# Patient Record
Sex: Female | Born: 1965 | Race: White | Hispanic: No | Marital: Married | Smoking: Never smoker
Health system: Southern US, Community
[De-identification: ages and names within clinical notes are randomized; demographics above are authoritative.]

## PROBLEM LIST (undated history)

## (undated) DIAGNOSIS — R000 Tachycardia, unspecified: Secondary | ICD-10-CM

## (undated) DIAGNOSIS — E119 Type 2 diabetes mellitus without complications: Secondary | ICD-10-CM

## (undated) HISTORY — DX: Type 2 diabetes mellitus without complications: E11.9

## (undated) HISTORY — DX: Tachycardia, unspecified: R00.0

## (undated) HISTORY — PX: KNEE SURGERY: SHX244

## (undated) HISTORY — PX: CHOLECYSTECTOMY: SHX55

## (undated) HISTORY — PX: TUBAL LIGATION: SHX77

## (undated) HISTORY — PX: TMJ ARTHROPLASTY: SHX1066

## (undated) HISTORY — PX: OTHER SURGICAL HISTORY: SHX169

---

## 2005-06-25 ENCOUNTER — Ambulatory Visit: Payer: Self-pay | Admitting: Occupational Therapy

## 2006-02-13 ENCOUNTER — Ambulatory Visit (HOSPITAL_COMMUNITY): Admission: RE | Admit: 2006-02-13 | Discharge: 2006-02-13 | Payer: Self-pay | Admitting: Nurse Practitioner

## 2012-12-31 DIAGNOSIS — C562 Malignant neoplasm of left ovary: Secondary | ICD-10-CM | POA: Insufficient documentation

## 2012-12-31 HISTORY — DX: Malignant neoplasm of left ovary: C56.2

## 2013-01-12 ENCOUNTER — Encounter: Payer: Self-pay | Admitting: *Deleted

## 2013-01-17 ENCOUNTER — Ambulatory Visit: Payer: BC Managed Care – PPO | Attending: Gynecology | Admitting: Gynecology

## 2013-01-17 ENCOUNTER — Encounter: Payer: Self-pay | Admitting: Gynecology

## 2013-01-17 DIAGNOSIS — C562 Malignant neoplasm of left ovary: Secondary | ICD-10-CM

## 2013-01-17 DIAGNOSIS — C569 Malignant neoplasm of unspecified ovary: Secondary | ICD-10-CM

## 2013-01-17 DIAGNOSIS — D3912 Neoplasm of uncertain behavior of left ovary: Secondary | ICD-10-CM

## 2013-01-17 HISTORY — DX: Malignant neoplasm of unspecified ovary: C56.9

## 2013-01-17 NOTE — Patient Instructions (Signed)
Dr. Lavonda Jumbo office will contact you to scheduled initial evaluation and chemotherapy. Prior to starting chemotherapy and recommend we obtain a CT scan of the abdomen and pelvis as well as tumor markers.

## 2013-01-17 NOTE — Progress Notes (Signed)
Consult Note: Gyn-Onc   Nicole Beck 47 y.o. female  Chief Complaint  Patient presents with  . 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 I recommend she received 4 cycles of BEP. Prior to initiating chemotherapy I recommend she have a baseline CT scan of the abdomen and pelvis and baseline tumor markers including inhibin B. and AMH.  We'll refer the patient to Dr. Zipporah Plants in [place] to manage the chemotherapy.  30 minutes of face-to-face consultation time was spent with the patient her husband and sister.    HPI: Patient is seen in consultation at the request of Dr.Chris Senaida Ores regarding management of a newly diagnosed granulosa cell tumor of the left ovary. Patient initially presented several weeks prior to surgery with abdominal pain and apparent rupture of the ovarian mass which measured 9 cm. According to the operative note there was no evidence of other metastatic disease. The patient's had an uncomplicated postoperative course. She has no past gynecologic history.  Review of Systems:10 point review of systems is negative except as noted in interval history.   Vitals: There were no vitals taken for this visit.  Physical Exam:  Deferred     No Known Allergies  Past Medical History  Diagnosis Date  . Tachycardia   . Diabetes mellitus without complication     diet controlled    Past Surgical History  Procedure Laterality Date  . Knee surgery Left   . Tmj arthroplasty Right   . Cholecystectomy    . Left salpingoophorectomy Left   . Tubal ligation      Current Outpatient Prescriptions  Medication Sig Dispense Refill  . diltiazem (DILACOR XR) 180 MG 24 hr capsule Take 180 mg by mouth daily.      Marland Kitchen omeprazole (PRILOSEC) 20 MG capsule Take 20 mg by mouth daily.       No  current facility-administered medications for this visit.    History   Social History  . Marital Status: Married    Spouse Name: N/A    Number of Children: N/A  . Years of Education: N/A   Occupational History  . Not on file.   Social History Main Topics  . Smoking status: Not on file  . Smokeless tobacco: Never Used  . Alcohol Use: No  . Drug Use: No  . Sexually Active: Yes   Other Topics Concern  . Not on file   Social History Narrative  . No narrative on file    Family History  Problem Relation Age of Onset  . Diabetes Mother   . Cancer Mother   . CAD Father   . Diabetes Sister   . CAD Sister   . Cancer Sister   . Diabetes Maternal Grandmother   . CAD Maternal Grandfather   . Cancer Maternal Aunt       Jeannette Corpus, MD 01/17/2013, 1:18 PM

## 2013-04-26 ENCOUNTER — Other Ambulatory Visit: Payer: Self-pay | Admitting: Gynecologic Oncology

## 2013-04-26 ENCOUNTER — Telehealth: Payer: Self-pay | Admitting: Gynecologic Oncology

## 2013-04-26 NOTE — Telephone Encounter (Signed)
Contacted [facility] and spoke with the RN for Dr. Gilman Buttner about Dr. Nelwyn Salisbury recommendations to see the patient after her CT and lab work.  Verbalizing understanding.

## 2013-07-29 ENCOUNTER — Encounter: Payer: Self-pay | Admitting: Gynecology

## 2013-07-29 ENCOUNTER — Ambulatory Visit: Payer: BC Managed Care – PPO | Attending: Gynecology | Admitting: Gynecology

## 2013-07-29 VITALS — BP 133/89 | HR 96 | Temp 97.8°F | Resp 16 | Ht 67.0 in | Wt 191.6 lb

## 2013-07-29 DIAGNOSIS — Z79899 Other long term (current) drug therapy: Secondary | ICD-10-CM | POA: Insufficient documentation

## 2013-07-29 DIAGNOSIS — C569 Malignant neoplasm of unspecified ovary: Secondary | ICD-10-CM | POA: Insufficient documentation

## 2013-07-29 DIAGNOSIS — I2699 Other pulmonary embolism without acute cor pulmonale: Secondary | ICD-10-CM | POA: Insufficient documentation

## 2013-07-29 DIAGNOSIS — R000 Tachycardia, unspecified: Secondary | ICD-10-CM | POA: Insufficient documentation

## 2013-07-29 DIAGNOSIS — E119 Type 2 diabetes mellitus without complications: Secondary | ICD-10-CM | POA: Insufficient documentation

## 2013-07-29 DIAGNOSIS — D3912 Neoplasm of uncertain behavior of left ovary: Secondary | ICD-10-CM

## 2013-07-29 DIAGNOSIS — G589 Mononeuropathy, unspecified: Secondary | ICD-10-CM | POA: Insufficient documentation

## 2013-07-29 DIAGNOSIS — Z7901 Long term (current) use of anticoagulants: Secondary | ICD-10-CM | POA: Insufficient documentation

## 2013-07-29 NOTE — Progress Notes (Signed)
Consult Note: Gyn-Onc   Nicole Beck 47 y.o. female  Chief Complaint  Patient presents with  . 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 The patient appears to be clinically free of disease.  Persistent neuropathy, pulmonary embolus,  Plan:  Once the patient completes her 6 month course of warfarin, she is planning on undergoing a total abdominal hysterectomy and right salpingo-oophorectomy under the direction of Dr. Lester [place]. At the time of surgery I recommend full intraperitoneal exploration as well as obtaining peritoneal cytology. Recommend the patient continue to have inhibin B. and AMH values obtained at six-month intervals postoperatively.  Interval history:  Since her initial consultation the patient is completed 4 cycles of BEP under the direction of Dr. Gery Pray in [place].  Her treatment course was complicated by significant neuropathy (which is improving) as well as a pulmonary embolus (currently on warfarin). Tumor markers in September revealed an a MH and inhibin B. is normal.  The patient denies any GI or GU symptoms has no pelvic pain pressure or vaginal bleeding. Her last menstrual period was in June 2014.    HPI: Patient is seen in consultation at the request of Dr.Chris Senaida Ores regarding management of a newly diagnosed granulosa cell tumor of the left ovary. Patient initially presented several weeks prior to surgery with abdominal pain and apparent rupture of the ovarian mass which measured 9 cm. According to the operative note there was no evidence of other metastatic disease. The patient's had an uncomplicated postoperative course. She has no past gynecologic history.  Review of Systems:10 point review of systems is negative except as noted in interval history.   Vitals: Blood pressure 133/89, pulse 96, temperature 97.8 F (36.6 C),  resp. rate 16, height 5\' 7"  (1.702 m), weight 191 lb 9.6 oz (86.909 kg), last menstrual period 03/18/2013.  Physical Exam: HEENT: Normal  Neck is supple without thyromegaly.  Abdomen is soft nontender no masses again a megaly or ascites are noted.  Pelvic exam  EGBUS normal  Vagina normal  Cervix normal  Uterus is anterior normal shape size consistency  Adnexa without masses  Rectovaginal exam confirms.  Lower extremities without edema or varicosities.     No Known Allergies  Past Medical History  Diagnosis Date  . Tachycardia   . Diabetes mellitus without complication     diet controlled    Past Surgical History  Procedure Laterality Date  . Knee surgery Left   . Tmj arthroplasty Right   . Cholecystectomy    . Left salpingoophorectomy Left   . Tubal ligation      Current Outpatient Prescriptions  Medication Sig Dispense Refill  . diltiazem (DILACOR XR) 180 MG 24 hr capsule Take 180 mg by mouth daily.      . sertraline (ZOLOFT) 50 MG tablet Take 50 mg by mouth daily.      . tapentadol (NUCYNTA) 50 MG TABS tablet Take 50 mg by mouth daily. Takes at night only.      . warfarin (COUMADIN) 5 MG tablet Take 5 mg by mouth daily. 2 whole tablets twice a week on Monday and Friday. The rest of the week 1/2 tablet(Tues, wed,thur,sat, sun)      . omeprazole (PRILOSEC) 20 MG capsule Take 20 mg by mouth daily.       No current facility-administered medications for this visit.  History   Social History  . Marital Status: Married    Spouse Name: N/A    Number of Children: N/A  . Years of Education: N/A   Occupational History  . Not on file.   Social History Main Topics  . Smoking status: Never Smoker   . Smokeless tobacco: Never Used  . Alcohol Use: No  . Drug Use: No  . Sexual Activity: Yes   Other Topics Concern  . Not on file   Social History Narrative  . No narrative on file    Family History  Problem Relation Age of Onset  . Diabetes Mother    . Cancer Mother   . CAD Father   . Diabetes Sister   . CAD Sister   . Cancer Sister   . Diabetes Maternal Grandmother   . CAD Maternal Grandfather   . Cancer Maternal Aunt       Jeannette Corpus, MD 07/29/2013, 11:51 AM

## 2013-07-29 NOTE — Patient Instructions (Signed)
Dr. Gilman Buttner will follow you. we would be happy to see you in the future if needed.

## 2015-01-19 ENCOUNTER — Ambulatory Visit: Payer: Self-pay | Admitting: Gynecologic Oncology

## 2015-01-19 ENCOUNTER — Telehealth: Payer: Self-pay | Admitting: *Deleted

## 2015-01-19 NOTE — Telephone Encounter (Signed)
Called patient to reschedule missed MD visit this morning. Patient states she called this morning and left a voicemail for scheduling stating she was unable to make visit. I asked patient what phone number she called and patient states it was "whatever number was on the automated message reminder call." Patient rescheduled to Monday, 01/22/15, at 2:30pm. Patient agreeable to new appt.

## 2015-01-22 ENCOUNTER — Encounter: Payer: Self-pay | Admitting: Gynecologic Oncology

## 2015-01-22 ENCOUNTER — Ambulatory Visit: Payer: 59 | Attending: Gynecologic Oncology | Admitting: Gynecologic Oncology

## 2015-01-22 DIAGNOSIS — Z86711 Personal history of pulmonary embolism: Secondary | ICD-10-CM

## 2015-01-22 DIAGNOSIS — R600 Localized edema: Secondary | ICD-10-CM | POA: Diagnosis not present

## 2015-01-22 DIAGNOSIS — C562 Malignant neoplasm of left ovary: Secondary | ICD-10-CM | POA: Insufficient documentation

## 2015-01-22 DIAGNOSIS — Z8543 Personal history of malignant neoplasm of ovary: Secondary | ICD-10-CM | POA: Diagnosis not present

## 2015-01-22 NOTE — Patient Instructions (Signed)
Preparing for your Surgery  Plan for surgery on July 12 with Dr. Denman George.  Pre-operative Testing -You will receive a phone call from presurgical testing at [facility] to arrange for a pre-operative testing appointment before your surgery.  This appointment normally occurs one to two weeks before your scheduled surgery.   -Bring your insurance card, copy of an advanced directive if applicable, medication list  -At that visit, you will be asked to sign a consent for a possible blood transfusion in case a transfusion becomes necessary during surgery.  The need for a blood transfusion is rare but having consent is a necessary part of your care.     -You should not be taking blood thinners or aspirin at least ten days prior to surgery unless instructed by your surgeon.  Day Before Surgery at [facility] will be asked to take in only clear liquids the day before surgery.  Examples of clear liquids include broths, jello, and clear juices.  You will be advised to have nothing to eat or drink after midnight the evening before.    Your role in recovery Your role is to become active as soon as directed by your doctor, while still giving yourself time to heal.  Rest when you feel tired. You will be asked to do the following in order to speed your recovery:  - Cough and breathe deeply. This helps toclear and expand your lungs and can prevent pneumonia. You may be given a spirometer to practice deep breathing. A staff member will show you how to use the spirometer. - Do mild physical activity. Walking or moving your legs help your circulation and body functions return to normal. A staff member will help you when you try to walk and will provide you with simple exercises. Do not try to get up or walk alone the first time. - Actively manage your pain. Managing your pain lets you move in comfort. We will ask you to rate your pain on a scale of zero to 10. It is your responsibility to tell  your doctor or nurse where and how much you hurt so your pain can be treated.  Special Considerations -If you are diabetic, you may be placed on insulin after surgery to have closer control over your blood sugars to promote healing and recovery.  This does not mean that you will be discharged on insulin.  If applicable, your oral antidiabetics will be resumed when you are tolerating a solid diet.  -Your final pathology results from surgery should be available by the Friday after surgery and the results will be relayed to you when available.  Blood Transfusion Information WHAT IS A BLOOD TRANSFUSION? A transfusion is the replacement of blood or some of its parts. Blood is made up of multiple cells which provide different functions.  Red blood cells carry oxygen and are used for blood loss replacement.  White blood cells fight against infection.  Platelets control bleeding.  Plasma helps clot blood.  Other blood products are available for specialized needs, such as hemophilia or other clotting disorders. BEFORE THE TRANSFUSION  Who gives blood for transfusions?   You may be able to donate blood to be used at a later date on yourself (autologous donation).  Relatives can be asked to donate blood. This is generally not any safer than if you have received blood from a stranger. The same precautions are taken to ensure safety when a relative's blood is donated.  Healthy volunteers who are fully  evaluated to make sure their blood is safe. This is blood bank blood. Transfusion therapy is the safest it has ever been in the practice of medicine. Before blood is taken from a donor, a complete history is taken to make sure that person has no history of diseases nor engages in risky social behavior (examples are intravenous drug use or sexual activity with multiple partners). The donor's travel history is screened to minimize risk of transmitting infections, such as malaria. The donated blood is  tested for signs of infectious diseases, such as HIV and hepatitis. The blood is then tested to be sure it is compatible with you in order to minimize the chance of a transfusion reaction. If you or a relative donates blood, this is often done in anticipation of surgery and is not appropriate for emergency situations. It takes many days to process the donated blood. RISKS AND COMPLICATIONS Although transfusion therapy is very safe and saves many lives, the main dangers of transfusion include:   Getting an infectious disease.  Developing a transfusion reaction. This is an allergic reaction to something in the blood you were given. Every precaution is taken to prevent this. The decision to have a blood transfusion has been considered carefully by your caregiver before blood is given. Blood is not given unless the benefits outweigh the risks.

## 2015-01-22 NOTE — Progress Notes (Signed)
Consult Note: Gyn-Onc   Nicole Beck 49 y.o. female  Chief Complaint  Patient presents with  . 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 The patient appears to be clinically free of disease.  Persistent neuropathy, pulmonary embolus (June 2014),  Plan:  Now that she has completed her 6 month course of warfarin, she is interested in having a completion hysterectomy and right salpingo-oophorectomy. I will discuss with Dr. Ellouise Newer whether he would be interestd in performing this. At the time of surgery I recommend full intraperitoneal exploration as well as obtaining peritoneal cytology. Recommend the patient continue to have inhibin B. and AMH values obtained at six-month intervals postoperatively. We will check her next set at preoperative evaluation.  If her lower extremity edema increases we should repeat imaging of the pelvis to rule out new lymphadenopathy (will hold off for now because patient feels some improvement with diet control).  Interval history:  Since her initial consultation the patient is completed 4 cycles of BEP under the direction of Dr. Hosie Poisson in [place].  Her treatment course was complicated by significant neuropathy (which is improving) as well as a pulmonary embolus (currently on warfarin). Tumor markers in September 2015 revealed an a MH and inhibin B. is normal.  CT abdo/pelvis January 2016 showed a stable right ovarian cyst and no lymphadenopathy or carcinomatosis  The patient denies any GI or GU symptoms has no pelvic pain pressure or vaginal bleeding. Her last menstrual period was in June 2014. She denies postmenopausal bleeding. She has noted increased right lower extremity edema in the past 3 months, however feels this is secondary to her celiac's disease.   HPI: Patient was initially seen in consultation at the  request of Dr.Chris Marvel Plan regarding management of a newly diagnosed granulosa cell tumor of the left ovary. Patient initially presented several weeks prior to surgery with abdominal pain and apparent rupture of the ovarian mass which measured 9 cm. According to the operative note there was no evidence of other metastatic disease. The patient's had an uncomplicated postoperative course. She has no past gynecologic history.  Review of Systems:10 point review of systems is negative except as noted in interval history.   Vitals: There were no vitals taken for this visit.  Physical Exam: HEENT: Normal  Neck is supple without thyromegaly.  Abdomen is soft nontender no masses again a megaly or ascites are noted.  Pelvic exam  EGBUS normal  Vagina normal  Cervix normal  Uterus is anterior normal shape size consistency  Adnexa without masses  Rectovaginal exam confirms.  Lower extremities without edema or varicosities.     No Known Allergies  Past Medical History  Diagnosis Date  . Tachycardia   . Diabetes mellitus without complication     diet controlled    Past Surgical History  Procedure Laterality Date  . Knee surgery Left   . Tmj arthroplasty Right   . Cholecystectomy    . Left salpingoophorectomy Left   . Tubal ligation      Current Outpatient Prescriptions  Medication Sig Dispense Refill  . diltiazem (DILACOR XR) 180 MG 24 hr capsule Take 180 mg by mouth daily.    Marland Kitchen omeprazole (PRILOSEC) 20 MG capsule Take 20 mg by mouth daily.    . sertraline (ZOLOFT) 50 MG tablet Take 50 mg by mouth daily.    Marland Kitchen  tapentadol (NUCYNTA) 50 MG TABS tablet Take 50 mg by mouth daily. Takes at night only.    . warfarin (COUMADIN) 5 MG tablet Take 5 mg by mouth daily. 2 whole tablets twice a week on Monday and Friday. The rest of the week 1/2 tablet(Tues, wed,thur,sat, sun)     No current facility-administered medications for this visit.    History   Social History  .  Marital Status: Married    Spouse Name: N/A  . Number of Children: N/A  . Years of Education: N/A   Occupational History  . Not on file.   Social History Main Topics  . Smoking status: Never Smoker   . Smokeless tobacco: Never Used  . Alcohol Use: No  . Drug Use: No  . Sexual Activity: Yes   Other Topics Concern  . Not on file   Social History Narrative    Family History  Problem Relation Age of Onset  . Diabetes Mother   . Cancer Mother   . CAD Father   . Diabetes Sister   . CAD Sister   . Cancer Sister   . Diabetes Maternal Grandmother   . CAD Maternal Grandfather   . Cancer Maternal Aunt       Donaciano Eva, MD 01/22/2015, 3:49 PM

## 2015-01-26 ENCOUNTER — Telehealth: Payer: Self-pay | Admitting: Gynecologic Oncology

## 2015-01-26 NOTE — Telephone Encounter (Signed)
Called to speak with patient about Coumadin being on her medication list.  Patient stating she has not been on this for awhile.  She met with Dr. Hinton Rao today and stated she would like to hold off on having surgery right now because she will be seeing Dr. Hinton Rao every four months and it is too stressful for her right now.  Informed that her surgery will be canceled.  She stated she would consider it "if something changes in my blood."  Advised to call for any questions or concerns.

## 2015-03-01 ENCOUNTER — Telehealth: Payer: Self-pay | Admitting: *Deleted

## 2015-03-01 NOTE — Telephone Encounter (Signed)
VM message from pt received @ 11:13 am regarding new vaginal bleeding.  She reports she has not had any vaginal bleeding since June 2014 and she is concerned that something is wrong.  Please call pt.

## 2015-03-12 ENCOUNTER — Other Ambulatory Visit (HOSPITAL_BASED_OUTPATIENT_CLINIC_OR_DEPARTMENT_OTHER): Payer: 59

## 2015-03-12 ENCOUNTER — Encounter: Payer: Self-pay | Admitting: Gynecologic Oncology

## 2015-03-12 ENCOUNTER — Ambulatory Visit: Payer: 59 | Attending: Gynecologic Oncology | Admitting: Gynecologic Oncology

## 2015-03-12 ENCOUNTER — Other Ambulatory Visit (HOSPITAL_COMMUNITY)
Admission: RE | Admit: 2015-03-12 | Discharge: 2015-03-12 | Disposition: A | Discharge status: Home / Self Care | Payer: 59 | Source: Ambulatory Visit | Attending: Gynecologic Oncology | Admitting: Gynecologic Oncology

## 2015-03-12 VITALS — BP 118/75 | HR 82 | Temp 98.0°F | Resp 18 | Ht 67.0 in | Wt 213.5 lb

## 2015-03-12 DIAGNOSIS — Z8543 Personal history of malignant neoplasm of ovary: Secondary | ICD-10-CM

## 2015-03-12 DIAGNOSIS — N939 Abnormal uterine and vaginal bleeding, unspecified: Secondary | ICD-10-CM

## 2015-03-12 DIAGNOSIS — Z01411 Encounter for gynecological examination (general) (routine) with abnormal findings: Secondary | ICD-10-CM | POA: Diagnosis present

## 2015-03-12 DIAGNOSIS — Z9221 Personal history of antineoplastic chemotherapy: Secondary | ICD-10-CM

## 2015-03-12 DIAGNOSIS — Z86711 Personal history of pulmonary embolism: Secondary | ICD-10-CM | POA: Diagnosis not present

## 2015-03-12 NOTE — Progress Notes (Signed)
Consult Note: Gyn-Onc   Nicole Beck 49 y.o. female  Chief Complaint  Patient presents with  . 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 The patient appears to be clinically free of disease, however has postmenopausal bleeding x 1 episode.  History of pulmonary embolus (June 2014),  Plan:  Now that she has completed her 6 month course of warfarin (in December 2014), she was recommended to have a completion hysterectomy and right salpingo-oophorectomy.Dr. Ellouise Newer is her primary Gynecologist. I had discussed surgery with the patient in April 2016, and discussed perioperative risks. She had initially scheduled the surgery, but then cancelled after becoming very concerned about surgical risks. She is continuing to elect to hold off having a hysterectomy unless an abnormality is found.   I see no signs of abnormality on clinical exam however, on my differential is recurrence of her granulosa cell tumor causing endometrial proliferation and bleeding. Therefore we have ordered an [place] (to evaluate for menopausal status after chemotherapy), an AMH and inhibin B (to monitor for recurrent granulosa cell tumor), and a transvaginal ultrasound to evaluate the endometrial stripe and remaining right ovary and tube. If there is evidence of endometrial hyperplasia or thickening on ultrasound, or abnormal elevations in her tumor markers, I will strongly recommend surgical intervention with hysterectomy RSO, omentectomy and peritoneal sampling. If all are normal and her [facility] suggests that she is premenopausal, we will continue to follow expectantly.  I will see her back in 3 months for ongoing surveillance of her granulosa cell tumor.   HPI: Patient was initially seen in consultation at the request of Dr.Chris Marvel Plan regarding management of a newly diagnosed granulosa cell tumor  of the left ovary. Patient initially presented several weeks prior to surgery with abdominal pain and apparent rupture of the ovarian mass which measured 9 cm. According to the operative note there was no evidence of other metastatic disease. She developed a PE in June, 2014 and was treated with warfarin until December 2014.   She has no other past gynecologic history with the exception of her GCT.  She completed 4 cycles of BEP under the direction of Dr. Hosie Poisson in [place].  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   CT abdo/pelvis January 2016 showed a stable right ovarian cyst and no lymphadenopathy or carcinomatosis  Tumor markers in April 2016 revealed an a MH and inhibin B. is normal.  Interval Hx:  She began developing symptoms of vaginal spotting x 4 days in May, 2016. This worried her greatly as she had not had a period since June, 2014.  Review of Systems:10 point review of systems is negative except as noted in interval history.   Vitals: Blood pressure 118/75, pulse 82, temperature 98 F (36.7 C), temperature source Oral, resp. rate 18, height 5\' 7"  (1.702 m), weight 213 lb 8 oz (96.843 kg), SpO2 100 %.  Physical Exam: HEENT: Normal  Neck is supple without thyromegaly.  Abdomen is soft nontender no masses again a megaly or ascites are noted.  Pelvic exam  EGBUS normal  Vagina normal  Cervix normal. Pap taken.  Uterus is anterior normal shape size consistency  Adnexa without masses  Rectovaginal exam confirms.  Lower extremities without edema or varicosities.     No Known Allergies  Past Medical History  Diagnosis Date  . Tachycardia   . Diabetes mellitus without complication     diet controlled    Past Surgical History  Procedure Laterality Date  . Knee surgery Left   . Tmj arthroplasty Right   . Cholecystectomy    . Left salpingoophorectomy Left    . Tubal ligation      Current Outpatient Prescriptions  Medication Sig Dispense Refill  . albuterol (PROVENTIL) (2.5 MG/3ML) 0.083% nebulizer solution as needed.     . Cyanocobalamin (VITAMIN B-12 IJ) Inject as directed. Per pt, she gets vitamin B12 injection monthly at PCP office    . diltiazem (DILACOR XR) 180 MG 24 hr capsule Take 180 mg by mouth daily.    Marland Kitchen omeprazole (PRILOSEC) 20 MG capsule Take 20 mg by mouth daily.    . diazepam (VALIUM) 2 MG tablet TAKE 1 TABLET BY MOUTH 3 TIMES A DAY AS NEEDED FOR SPASMS  0   No current facility-administered medications for this visit.    History   Social History  . Marital Status: Married    Spouse Name: N/A  . Number of Children: N/A  . Years of Education: N/A   Occupational History  . Not on file.   Social History Main Topics  . Smoking status: Never Smoker   . Smokeless tobacco: Never Used  . Alcohol Use: No  . Drug Use: No  . Sexual Activity: Yes   Other Topics Concern  . Not on file   Social History Narrative    Family History  Problem Relation Age of Onset  . Diabetes Mother   . Cancer Mother   . CAD Father   . Diabetes Sister   . CAD Sister   . Cancer Sister   . Diabetes Maternal Grandmother   . CAD Maternal Grandfather   . Cancer Maternal Aunt       Donaciano Eva, MD 03/12/2015, 10:24 AM

## 2015-03-12 NOTE — Patient Instructions (Signed)
We will call you with the results of your pap smear and labs from today. Followup with Dr. Denman George in 3 months as scheduled or please call us sooner with any additional questions or concerns. U/S scheduled June 20th @ 10:00 we will call you with results once scan is complete and has been reviewed.

## 2015-03-15 LAB — INHIBIN B: Inhibin B: <10 pg/mL

## 2015-03-15 LAB — INHIBIN A: Inhibin-A: <1 pg/mL

## 2015-03-15 LAB — FOLLICLE STIMULATING HORMONE: FSH: 76.3 m[IU]/mL

## 2015-03-15 LAB — ANTI MULLERIAN HORMONE: AMH AssessR: <0.03 ng/mL

## 2015-03-16 LAB — CYTOLOGY - PAP

## 2015-03-19 ENCOUNTER — Other Ambulatory Visit: Payer: Self-pay | Admitting: Gynecologic Oncology

## 2015-03-19 ENCOUNTER — Ambulatory Visit (HOSPITAL_COMMUNITY)
Admission: RE | Admit: 2015-03-19 | Discharge: 2015-03-19 | Disposition: A | Discharge status: Home / Self Care | Payer: 59 | Source: Ambulatory Visit | Attending: Gynecologic Oncology | Admitting: Gynecologic Oncology

## 2015-03-19 ENCOUNTER — Ambulatory Visit (HOSPITAL_COMMUNITY): Payer: 59

## 2015-03-19 DIAGNOSIS — N939 Abnormal uterine and vaginal bleeding, unspecified: Secondary | ICD-10-CM

## 2015-03-20 ENCOUNTER — Telehealth: Payer: Self-pay | Admitting: Gynecologic Oncology

## 2015-03-20 NOTE — Telephone Encounter (Signed)
Spoke with patient about pap results and lab results.  Based on her lab results, she is post menopausal per Dr. Denman George.  It was recommended that she proceed with an ultrasound to evaluate the endometrium due to post-menopausal bleeding.  Ultrasound reviewed by Dr. Denman George, who recommended the patient come into the office for an endometrial biopsy.  Patient verbalizing understanding.  Appt made for June 27.  Advised to call for any questions or concerns.

## 2015-03-26 ENCOUNTER — Ambulatory Visit: Payer: 59 | Attending: Gynecologic Oncology | Admitting: Gynecologic Oncology

## 2015-03-26 ENCOUNTER — Encounter: Payer: Self-pay | Admitting: Gynecologic Oncology

## 2015-03-26 ENCOUNTER — Ambulatory Visit: Payer: 59 | Admitting: Gynecologic Oncology

## 2015-03-26 VITALS — BP 111/82 | HR 85 | Temp 98.7°F | Resp 20 | Ht 67.0 in | Wt 214.1 lb

## 2015-03-26 DIAGNOSIS — D3912 Neoplasm of uncertain behavior of left ovary: Secondary | ICD-10-CM

## 2015-03-26 NOTE — Patient Instructions (Signed)
We will contact you with the results of your endometrial biopsy from today.  Please call for any questions or concerns.  You may have vaginal spotting after this procedure.  Endometrial Biopsy, Care After Refer to this sheet in the next few weeks. These instructions provide you with information on caring for yourself after your procedure. Your health care provider may also give you more specific instructions. Your treatment has been planned according to current medical practices, but problems sometimes occur. Call your health care provider if you have any problems or questions after your procedure. WHAT TO EXPECT AFTER THE PROCEDURE After your procedure, it is typical to have the following:  You may have mild cramping and a small amount of vaginal bleeding for a few days after the procedure. This is normal. HOME CARE INSTRUCTIONS  Only take over-the-counter or prescription medicine as directed by your health care provider.  Do not douche, use tampons, or have sexual intercourse until your health care provider approves.  Follow your health care provider's instructions regarding any activity restrictions, such as strenuous exercise or heavy lifting. SEEK MEDICAL CARE IF:  You have heavy bleeding or bleeding longer than 2 days after the procedure.  You have bad smelling drainage from your vagina.  You have a fever and chills.  Youhave severe lower stomach (abdominal) pain. SEEK IMMEDIATE MEDICAL CARE IF:  You have severe cramps in your stomach or back.  You pass large blood clots.  Your bleeding increases.  You become weak or lightheaded, or you pass out. Document Released: 07/06/2013 Document Reviewed: 07/06/2013 [facility] Patient Information 2015 [place], [place]. This information is not intended to replace advice given to you by your health care provider. Make sure you discuss any questions you have with your health care provider.

## 2015-03-26 NOTE — Progress Notes (Signed)
Consult Note: Gyn-Onc   Nicole Beck 49 y.o. female  Chief Complaint  Patient presents with  . 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 The patient appears to be clinically free of disease, however has postmenopausal bleeding x 1 episode, thickened endometrial stripe (26mm) on [place], and labs that reflect postmenopausal status.  History of pulmonary embolus (June 2014),  Plan:  Now that she has completed her 6 month course of warfarin (in December 2014), she was recommended to have a completion hysterectomy and right salpingo-oophorectomy.Dr. Ellouise Newer is her primary Gynecologist. I had discussed surgery with the patient in April 2016, and discussed perioperative risks. She had initially scheduled the surgery, but then cancelled after becoming very concerned about surgical risks. She is continuing to elect to hold off having a hysterectomy unless an abnormality is found.   Follow-up results of biopsy - if malignant, recommend surgical staging. If benign, continue to follow expectantly.   HPI: Patient was initially seen in consultation at the request of Dr.Chris Marvel Plan regarding management of a newly diagnosed granulosa cell tumor of the left ovary. Patient initially presented several weeks prior to surgery with abdominal pain and apparent rupture of the ovarian mass which measured 9 cm. According to the operative note there was no evidence of other metastatic disease. She developed a PE in June, 2014 and was treated with warfarin until December 2014.   She has no other past gynecologic history with the exception of her GCT.  She completed 4 cycles of BEP under the direction of Dr. Hosie Poisson in [place].  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   CT  abdo/pelvis January 2016 showed a stable right ovarian cyst and no lymphadenopathy or carcinomatosis  Tumor markers in April 2016 revealed an a MH and inhibin B. is normal.  She experienced postmenopausal bleeding in May 2016. FSH was elevated (postmenopausal) at 76, AMR is normal as was inhibin B. All within postmenopausal range. Transvaginal US on 03/19/15 showed a 6.6x3.1x4.1cm uterus with a 26mm endometrial stripe. The right ovary contained a simple cyst with benign features and measured 3.9x2.2x2.7cm.   Interval Hx:  She has had no further spotting  Review of Systems:10 point review of systems is negative except as noted in interval history.   Vitals: Blood pressure 111/82, pulse 85, temperature 98.7 F (37.1 C), temperature source Oral, resp. rate 20, height 5\' 7"  (1.702 m), weight 214 lb 1.6 oz (97.115 kg), SpO2 100 %.  Physical Exam: HEENT: Normal  Neck is supple without thyromegaly.  Abdomen is soft nontender no masses again a megaly or ascites are noted.  Pelvic exam  EGBUS normal  Vagina normal  Cervix normal.   Uterus is anterior normal shape size consistency  Adnexa without masses  Rectovaginal exam confirms.  Lower extremities without edema or varicosities.  Procedure: Endometrial biopsy Endometrial Biopsy  The procedure was explained.  The speculum was placed and the cervix was grasped with a tenaculum.  The endometrial biopsy was performed with 2 passes of the endometrial biopsy pipelle. scant tissue was obtained. The uterus sounded to 5 cm.  The patient tolerated procedure     No Known Allergies  Past Medical History  Diagnosis Date  . Tachycardia   . Diabetes mellitus without complication  diet controlled    Past Surgical History  Procedure Laterality Date  . Knee surgery Left   . Tmj arthroplasty Right   . Cholecystectomy    . Left salpingoophorectomy Left   . Tubal ligation      Current Outpatient Prescriptions  Medication Sig  Dispense Refill  . albuterol (PROVENTIL) (2.5 MG/3ML) 0.083% nebulizer solution as needed.     . Cyanocobalamin (VITAMIN B-12 IJ) Inject as directed. Per pt, she gets vitamin B12 injection monthly at PCP office    . diazepam (VALIUM) 2 MG tablet TAKE 1 TABLET BY MOUTH 3 TIMES A DAY AS NEEDED FOR SPASMS  0  . diltiazem (DILACOR XR) 180 MG 24 hr capsule Take 180 mg by mouth daily.    Marland Kitchen omeprazole (PRILOSEC) 20 MG capsule Take 20 mg by mouth daily.    . cyclobenzaprine (FLEXERIL) 10 MG tablet      No current facility-administered medications for this visit.    History   Social History  . Marital Status: Married    Spouse Name: N/A  . Number of Children: N/A  . Years of Education: N/A   Occupational History  . Not on file.   Social History Main Topics  . Smoking status: Never Smoker   . Smokeless tobacco: Never Used  . Alcohol Use: No  . Drug Use: No  . Sexual Activity: Yes   Other Topics Concern  . Not on file   Social History Narrative    Family History  Problem Relation Age of Onset  . Diabetes Mother   . Cancer Mother   . CAD Father   . Diabetes Sister   . CAD Sister   . Cancer Sister   . Diabetes Maternal Grandmother   . CAD Maternal Grandfather   . Cancer Maternal Aunt       Donaciano Eva, MD 03/26/2015, 3:48 PM

## 2015-03-28 ENCOUNTER — Telehealth: Payer: Self-pay | Admitting: *Deleted

## 2015-03-28 NOTE — Telephone Encounter (Signed)
Per Joylene John, NP patient notified that endometrial biopsy did not show any cancer.  Patient appreciative of call. Reminded pt of f/u appt on 06/18/15 with Dr. Denman George. No other questions or concerns noted at this time.

## 2015-03-30 ENCOUNTER — Telehealth: Payer: Self-pay | Admitting: Gynecologic Oncology

## 2015-03-30 NOTE — Telephone Encounter (Signed)
Patient informed of the below message.  No concerns voiced.  Advised to call for any needs or if the bleeding developed again.

## 2015-03-30 NOTE — Telephone Encounter (Signed)
-----   Message from Everitt Amber, MD sent at 03/30/2015 10:03 AM EDT ----- Can we please let Nicole Beck know that her biopsy showed benign changes (a polyp which explains the bleeding). She does not require additional treatment unless the bleeding begins again. Everitt Amber

## 2015-04-10 ENCOUNTER — Encounter (HOSPITAL_COMMUNITY): Admission: RE | Payer: Self-pay | Source: Ambulatory Visit

## 2015-04-10 ENCOUNTER — Ambulatory Visit (HOSPITAL_COMMUNITY): Admission: RE | Admit: 2015-04-10 | Payer: 59 | Source: Ambulatory Visit | Admitting: Gynecologic Oncology

## 2015-04-10 SURGERY — ROBOTIC ASSISTED LAPAROSCOPIC VAGINAL HYSTERECTOMY
Anesthesia: General | Laterality: Right

## 2015-06-18 ENCOUNTER — Ambulatory Visit: Payer: 59 | Admitting: Gynecologic Oncology

## 2015-07-02 DIAGNOSIS — R0602 Shortness of breath: Secondary | ICD-10-CM

## 2015-07-02 DIAGNOSIS — R079 Chest pain, unspecified: Secondary | ICD-10-CM | POA: Insufficient documentation

## 2015-07-02 DIAGNOSIS — R002 Palpitations: Secondary | ICD-10-CM | POA: Insufficient documentation

## 2015-07-02 DIAGNOSIS — R0789 Other chest pain: Secondary | ICD-10-CM | POA: Insufficient documentation

## 2015-07-02 HISTORY — DX: Palpitations: R00.2

## 2015-07-02 HISTORY — DX: Shortness of breath: R06.02

## 2015-07-02 HISTORY — DX: Other chest pain: R07.89

## 2015-10-29 ENCOUNTER — Ambulatory Visit: Payer: Self-pay

## 2015-10-29 ENCOUNTER — Encounter: Payer: Self-pay | Admitting: Gynecologic Oncology

## 2015-10-29 ENCOUNTER — Ambulatory Visit: Payer: Self-pay | Attending: Gynecologic Oncology | Admitting: Gynecologic Oncology

## 2015-10-29 VITALS — BP 131/83 | HR 88 | Temp 97.8°F | Resp 20 | Ht 67.0 in | Wt 219.5 lb

## 2015-10-29 DIAGNOSIS — Z8543 Personal history of malignant neoplasm of ovary: Secondary | ICD-10-CM

## 2015-10-29 DIAGNOSIS — N83201 Unspecified ovarian cyst, right side: Secondary | ICD-10-CM | POA: Insufficient documentation

## 2015-10-29 DIAGNOSIS — R1031 Right lower quadrant pain: Secondary | ICD-10-CM | POA: Insufficient documentation

## 2015-10-29 DIAGNOSIS — E119 Type 2 diabetes mellitus without complications: Secondary | ICD-10-CM | POA: Insufficient documentation

## 2015-10-29 DIAGNOSIS — D3912 Neoplasm of uncertain behavior of left ovary: Secondary | ICD-10-CM | POA: Insufficient documentation

## 2015-10-29 DIAGNOSIS — Z86711 Personal history of pulmonary embolism: Secondary | ICD-10-CM | POA: Insufficient documentation

## 2015-10-29 DIAGNOSIS — Z7901 Long term (current) use of anticoagulants: Secondary | ICD-10-CM | POA: Insufficient documentation

## 2015-10-29 DIAGNOSIS — Z8249 Family history of ischemic heart disease and other diseases of the circulatory system: Secondary | ICD-10-CM | POA: Insufficient documentation

## 2015-10-29 DIAGNOSIS — Z90710 Acquired absence of both cervix and uterus: Secondary | ICD-10-CM | POA: Insufficient documentation

## 2015-10-29 DIAGNOSIS — G629 Polyneuropathy, unspecified: Secondary | ICD-10-CM | POA: Insufficient documentation

## 2015-10-29 DIAGNOSIS — R000 Tachycardia, unspecified: Secondary | ICD-10-CM | POA: Insufficient documentation

## 2015-10-29 DIAGNOSIS — Z833 Family history of diabetes mellitus: Secondary | ICD-10-CM | POA: Insufficient documentation

## 2015-10-29 DIAGNOSIS — Z809 Family history of malignant neoplasm, unspecified: Secondary | ICD-10-CM | POA: Insufficient documentation

## 2015-10-29 NOTE — Patient Instructions (Signed)
Plan to have an ultrasound and labs at [place].                  Preparing for your Surgery  Plan for surgery on December 04, 2015 with Dr. Everitt Amber.  You will be scheduled for a robotic assisted total hysterectomy, right salpingo-oophorectomy.  You will receive lovenox pre-operatively and also for one month post-op.  Pre-operative Testing -You will receive a phone call from presurgical testing at [facility] to arrange for a pre-operative testing appointment before your surgery.  This appointment normally occurs one to two weeks before your scheduled surgery.   -Bring your insurance card, copy of an advanced directive if applicable, medication list  -At that visit, you will be asked to sign a consent for a possible blood transfusion in case a transfusion becomes necessary during surgery.  The need for a blood transfusion is rare but having consent is a necessary part of your care.     -You should not be taking blood thinners or aspirin at least ten days prior to surgery unless instructed by your surgeon.  Day Before Surgery at [place] will be asked to take in only clear liquids the day before surgery.  Examples of clear liquids include broths, jello, and clear juices.  Avoid carbonated beverages.  You will be advised to have nothing to eat or drink after midnight the evening before.    Your role in recovery Your role is to become active as soon as directed by your doctor, while still giving yourself time to heal.  Rest when you feel tired. You will be asked to do the following in order to speed your recovery:  - Cough and breathe deeply. This helps toclear and expand your lungs and can prevent pneumonia. You may be given a spirometer to practice deep breathing. A staff member will show you how to use the spirometer. - Do mild physical activity. Walking or moving your legs help your circulation and body functions return to normal. A staff member will help you when you try to walk and  will provide you with simple exercises. Do not try to get up or walk alone the first time. - Actively manage your pain. Managing your pain lets you move in comfort. We will ask you to rate your pain on a scale of zero to 10. It is your responsibility to tell your doctor or nurse where and how much you hurt so your pain can be treated.  Special Considerations -If you are diabetic, you may be placed on insulin after surgery to have closer control over your blood sugars to promote healing and recovery.  This does not mean that you will be discharged on insulin.  If applicable, your oral antidiabetics will be resumed when you are tolerating a solid diet.  -Your final pathology results from surgery should be available by the Friday after surgery and the results will be relayed to you when available.  Blood Transfusion Information WHAT IS A BLOOD TRANSFUSION? A transfusion is the replacement of blood or some of its parts. Blood is made up of multiple cells which provide different functions.  Red blood cells carry oxygen and are used for blood loss replacement.  White blood cells fight against infection.  Platelets control bleeding.  Plasma helps clot blood.  Other blood products are available for specialized needs, such as hemophilia or other clotting disorders. BEFORE THE TRANSFUSION  Who gives blood for transfusions?   You may be able  to donate blood to be used at a later date on yourself (autologous donation).  Relatives can be asked to donate blood. This is generally not any safer than if you have received blood from a stranger. The same precautions are taken to ensure safety when a relative's blood is donated.  Healthy volunteers who are fully evaluated to make sure their blood is safe. This is blood bank blood. Transfusion therapy is the safest it has ever been in the practice of medicine. Before blood is taken from a donor, a complete history is taken to make sure that person has no  history of diseases nor engages in risky social behavior (examples are intravenous drug use or sexual activity with multiple partners). The donor's travel history is screened to minimize risk of transmitting infections, such as malaria. The donated blood is tested for signs of infectious diseases, such as HIV and hepatitis. The blood is then tested to be sure it is compatible with you in order to minimize the chance of a transfusion reaction. If you or a relative donates blood, this is often done in anticipation of surgery and is not appropriate for emergency situations. It takes many days to process the donated blood. RISKS AND COMPLICATIONS Although transfusion therapy is very safe and saves many lives, the main dangers of transfusion include:   Getting an infectious disease.  Developing a transfusion reaction. This is an allergic reaction to something in the blood you were given. Every precaution is taken to prevent this. The decision to have a blood transfusion has been considered carefully by your caregiver before blood is given. Blood is not given unless the benefits outweigh the risks.

## 2015-10-29 NOTE — Progress Notes (Signed)
Consult Note: Gyn-Onc   Nicole Beck 50 y.o. female  Chief Complaint  Patient presents with  . 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 New right lower quadrant abdominal pain.  History of pulmonary embolus (June 2014),  Plan:  Now that she has completed her 6 month course of warfarin (in December 2014), she was recommended to have a completion hysterectomy and right salpingo-oophorectomy.Dr. Ellouise Newer is her primary Gynecologist. I had discussed surgery with the patient in April 2016, and discussed perioperative risks. She had initially scheduled the surgery, but then cancelled after becoming very concerned about surgical risks. She is now feeling better about proceeding given her symptoms of abdominal pain.  1/ follow-up tumor markers 2/ [place] for pelvic pain to evaluate for measurable disease/lesion. 3/ schedule for robotic assisted total hysterectomy, RSO. Given that she is s/p adjuvant chemotherapy, I see little role for omentectomy or lymphadenectomy or biopsies unless a lesion is identified preop or intraop that requires confirmation of spread. I discussed operative risks including  bleeding, infection, damage to internal organs (such as bladder,ureters, bowels), blood clot, reoperation and rehospitalization. I discussed that she is at increased risks for these given her weight (BMI 34kg/m2). Recommend lovenox for 4 weeks prophylaxis postop given prior PE history in 2014, ovarian cancer, overweight, pelvic surgery.  HPI: Patient was initially seen in consultation at the request of Dr.Chris Marvel Plan regarding management of a newly diagnosed granulosa cell tumor of the left ovary. Patient initially presented several weeks prior to surgery with abdominal pain and apparent rupture of the ovarian mass which measured 9 cm. According to the  operative note there was no evidence of other metastatic disease. She developed a PE in June, 2014 and was treated with warfarin until December 2014.   She has no other past gynecologic history with the exception of her GCT.  She completed 4 cycles of BEP under the direction of Dr. Hosie Poisson in [place].  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   CT abdo/pelvis January 2016 showed a stable right ovarian cyst and no lymphadenopathy or carcinomatosis  Tumor markers in April 2016 revealed an a MH and inhibin B. is normal.  She experienced postmenopausal bleeding in May 2016. FSH was elevated (postmenopausal) at 76, AMR is normal as was inhibin B. All within postmenopausal range. Transvaginal US on 03/19/15 showed a 6.6x3.1x4.1cm uterus with a 32mm endometrial stripe. The right ovary contained a simple cyst with benign features and measured 3.9x2.2x2.7cm.   Endometrial biopsy on June 16/2016 was benign (polyp).  Interval Hx:  She has had no further spotting. However in the past 1 month has developed persistent and increasing in severity RLQ pain. It seemed to get better after she was more compliant with her celiac diet.   Review of Systems:10 point review of systems is negative except as noted in interval history (abdominal pain).  Vitals: Blood pressure 131/83, pulse 88, temperature 97.8 F (36.6 C), temperature source Oral, resp. rate 20, height 5\' 7"  (1.702 m), weight 219 lb 8 oz (99.565 kg), SpO2 97 %.  Physical Exam: HEENT: Normal  Neck is supple without thyromegaly.  Abdomen is soft nontender no masses again a megaly or ascites are noted.  Pelvic exam  EGBUS normal  Vagina  normal  Cervix normal.   Uterus is anterior normal shape size consistency  Adnexa without masses  Rectovaginal exam confirms.  Lower extremities without edema or varicosities.     No Known  Allergies  Past Medical History  Diagnosis Date  . Tachycardia   . Diabetes mellitus without complication ([place])     diet controlled    Past Surgical History  Procedure Laterality Date  . Knee surgery Left   . Tmj arthroplasty Right   . Cholecystectomy    . Left salpingoophorectomy Left   . Tubal ligation      Current Outpatient Prescriptions  Medication Sig Dispense Refill  . omeprazole (PRILOSEC) 20 MG capsule Take 20 mg by mouth daily.    Marland Kitchen albuterol (PROVENTIL) (2.5 MG/3ML) 0.083% nebulizer solution as needed. Reported on 10/29/2015    . cyclobenzaprine (FLEXERIL) 10 MG tablet Reported on 10/29/2015    . diazepam (VALIUM) 2 MG tablet Reported on 10/29/2015  0  . diltiazem (CARDIZEM CD) 120 MG 24 hr capsule TAKE 1 CAPSULE (120 MG TOTAL) BY MOUTH DAILY.  6  . diltiazem (DILACOR XR) 180 MG 24 hr capsule Take 180 mg by mouth daily. Reported on 10/29/2015     No current facility-administered medications for this visit.    Social History   Social History  . Marital Status: Married    Spouse Name: N/A  . Number of Children: N/A  . Years of Education: N/A   Occupational History  . Not on file.   Social History Main Topics  . Smoking status: Never Smoker   . Smokeless tobacco: Never Used  . Alcohol Use: No  . Drug Use: No  . Sexual Activity: Yes   Other Topics Concern  . Not on file   Social History Narrative    Family History  Problem Relation Age of Onset  . Diabetes Mother   . Cancer Mother   . CAD Father   . Diabetes Sister   . CAD Sister   . Cancer Sister   . Diabetes Maternal Grandmother   . CAD Maternal Grandfather   . Cancer Maternal Aunt       Donaciano Eva, MD 10/29/2015, 5:08 PM

## 2015-11-01 ENCOUNTER — Telehealth: Payer: Self-pay | Admitting: Gynecologic Oncology

## 2015-11-01 NOTE — Telephone Encounter (Signed)
Patient had called earlier asking about her lab results.  She went to [facility] and had an Inhibin B and AMH drawn.  Advised it may take up to a week to get the results back and that our office would give her a call once we received a fax with the results from [place].

## 2015-11-06 ENCOUNTER — Telehealth: Payer: Self-pay | Admitting: Gynecologic Oncology

## 2015-11-06 ENCOUNTER — Ambulatory Visit (HOSPITAL_COMMUNITY)
Admission: RE | Admit: 2015-11-06 | Discharge: 2015-11-06 | Disposition: A | Discharge status: Home / Self Care | Payer: BLUE CROSS/BLUE SHIELD | Source: Ambulatory Visit | Attending: Gynecologic Oncology | Admitting: Gynecologic Oncology

## 2015-11-06 DIAGNOSIS — N83201 Unspecified ovarian cyst, right side: Secondary | ICD-10-CM | POA: Diagnosis not present

## 2015-11-06 DIAGNOSIS — R1031 Right lower quadrant pain: Secondary | ICD-10-CM | POA: Diagnosis not present

## 2015-11-06 DIAGNOSIS — D3912 Neoplasm of uncertain behavior of left ovary: Secondary | ICD-10-CM | POA: Insufficient documentation

## 2015-11-06 NOTE — Telephone Encounter (Signed)
Informed patient of [place] results.  Patient is requesting script for her intermittent crying episodes.  "My hormones are all over the place.  I can be sitting in the office and start crying for no reason."  Patient advised that her symptoms would be discussed with Dr. Denman George and she would receive a call back from our office.  She does not pose a risk for harming others or harming herself.

## 2015-11-07 ENCOUNTER — Other Ambulatory Visit: Payer: Self-pay | Admitting: Gynecologic Oncology

## 2015-11-07 DIAGNOSIS — N951 Menopausal and female climacteric states: Secondary | ICD-10-CM

## 2015-11-07 MED ORDER — VENLAFAXINE HCL ER 37.5 MG PO CP24: 37.5000 mg | ORAL_CAPSULE | Freq: Every day | ORAL | Status: DC

## 2015-11-07 NOTE — Progress Notes (Signed)
Patient informed that Effexor 37.5 mg XL daily would be sent in for her per Dr. Denman George for her "hormones being all over the place and random crying episodes."  Potential side effects discussed and reportable signs and symptoms reviewed.  She is to call the office with an update.  Advised to call for any questions or concerns.

## 2015-11-15 ENCOUNTER — Telehealth: Payer: Self-pay | Admitting: Gynecologic Oncology

## 2015-11-15 NOTE — Telephone Encounter (Signed)
Patient called stating she would like to move her surgery to the beginning of June since she "has a lot of shows to due in the Spring."  No other concerns voiced.  Surgery moved to June 6.

## 2015-11-16 ENCOUNTER — Telehealth: Payer: Self-pay | Admitting: Gynecologic Oncology

## 2015-11-16 NOTE — Telephone Encounter (Signed)
Left message advising pt that Dr. Denman George is agreeable with moving her surgery to June 6 and she does not need to be seen in the office for a pre-op appt.

## 2016-02-28 ENCOUNTER — Encounter (HOSPITAL_COMMUNITY): Admission: RE | Admit: 2016-02-28 | Payer: BLUE CROSS/BLUE SHIELD | Source: Ambulatory Visit

## 2016-02-28 NOTE — Progress Notes (Signed)
Patient's surgery with Dr Everitt Amber on March 04, 2016 , patient states she has to cancel this surgical procedure ,due to being "dropped" from her insurance company. Melissa Ctross, APNP updated as well as [place] department. Patient to call when she has insurance coverage , patient will call in either July or August to rescheduled .

## 2016-03-04 ENCOUNTER — Ambulatory Visit (HOSPITAL_COMMUNITY)
Admission: RE | Admit: 2016-03-04 | Payer: BLUE CROSS/BLUE SHIELD | Source: Ambulatory Visit | Admitting: Gynecologic Oncology

## 2016-03-04 ENCOUNTER — Encounter (HOSPITAL_COMMUNITY): Admission: RE | Payer: Self-pay | Source: Ambulatory Visit

## 2016-03-04 SURGERY — HYSTERECTOMY, TOTAL, ROBOT-ASSISTED, LAPAROSCOPIC, WITH BILATERAL SALPINGO-OOPHORECTOMY
Anesthesia: General | Laterality: Right

## 2016-06-11 DIAGNOSIS — C569 Malignant neoplasm of unspecified ovary: Secondary | ICD-10-CM

## 2016-09-08 ENCOUNTER — Ambulatory Visit: Payer: Self-pay | Admitting: Gynecologic Oncology

## 2016-09-08 ENCOUNTER — Telehealth: Payer: Self-pay | Admitting: *Deleted

## 2016-09-08 NOTE — Telephone Encounter (Signed)
Pt called states " I started having dark red bleeding Saturday, Sunday it was bright red and heavy like I was having a period. I'm using panty liners." Pt advised having intercourse about 1-2 weeks ago, pt advised she has been under a lot of stress as she has been picking up her dog ( 70+ lbs) to get in and out of the house due to his sickness, she has been bending a lot and "this" ( the bleeding ) and was told if it happened again to be seen.  I was suppose to have surgery on June 6 but my insurance dropped me so I had to cancel.

## 2016-09-15 ENCOUNTER — Encounter: Payer: Self-pay | Admitting: Gynecologic Oncology

## 2016-09-15 ENCOUNTER — Ambulatory Visit: Payer: Self-pay | Attending: Gynecologic Oncology | Admitting: Gynecologic Oncology

## 2016-09-15 VITALS — BP 122/83 | HR 99 | Temp 98.5°F | Resp 18 | Ht 67.0 in | Wt 209.1 lb

## 2016-09-15 DIAGNOSIS — N950 Postmenopausal bleeding: Secondary | ICD-10-CM | POA: Insufficient documentation

## 2016-09-15 DIAGNOSIS — R1031 Right lower quadrant pain: Secondary | ICD-10-CM | POA: Insufficient documentation

## 2016-09-15 DIAGNOSIS — Z86711 Personal history of pulmonary embolism: Secondary | ICD-10-CM | POA: Insufficient documentation

## 2016-09-15 DIAGNOSIS — C562 Malignant neoplasm of left ovary: Secondary | ICD-10-CM

## 2016-09-15 DIAGNOSIS — D3912 Neoplasm of uncertain behavior of left ovary: Secondary | ICD-10-CM | POA: Insufficient documentation

## 2016-09-15 HISTORY — DX: Postmenopausal bleeding: N95.0

## 2016-09-15 NOTE — Progress Notes (Signed)
Consult Note: Gyn-Onc   Nicole Beck 50 y.o. female  Chief Complaint  Patient presents with  . 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 New right lower quadrant abdominal pain.  History of pulmonary embolus (June 2014),  Plan:  Now that she has completed her 6 month course of warfarin (in December 2014), she was recommended to have a completion hysterectomy and right salpingo-oophorectomy.Nicole Beck is her primary Gynecologist. I discussed surgery with the patient and discussed perioperative risks. She had initially scheduled the surgery, but then cancelled after becoming very concerned about surgical risks and insurance coverage. She is now feeling better about proceeding given her symptoms of vaginal bleeding.  1/ follow-up endometrial biopsy 2/ repeat LFTs and coags preop given known fatty liver 3/ schedule for robotic assisted total hysterectomy, RSO. Given that she is s/p adjuvant chemotherapy, I see little role for omentectomy or lymphadenectomy or biopsies unless a lesion is identified preop or intraop that requires confirmation of spread. I discussed operative risks including  bleeding, infection, damage to internal organs (such as bladder,ureters, bowels), blood clot, reoperation and rehospitalization. I discussed that she is at increased risks for these given her weight (BMI 34kg/m2). Recommend lovenox for 4 weeks prophylaxis postop given prior PE history in 2014, ovarian cancer, overweight, pelvic surgery.  HPI: Patient was initially seen in consultation at the request of Nicole Beck Plan regarding management of a newly diagnosed granulosa cell tumor of the left ovary. Patient initially presented several weeks prior to surgery with abdominal pain and apparent rupture of the ovarian mass which measured 9 cm. According to the operative  note there was no evidence of other metastatic disease. She developed a PE in June, 2014 and was treated with warfarin until December 2014.   She has no other past gynecologic history with the exception of her GCT.  She completed 4 cycles of BEP under the direction of Nicole Beck in [place].  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   CT abdo/pelvis January 2016 showed a stable right ovarian cyst and no lymphadenopathy or carcinomatosis  Tumor markers in April 2016 revealed an a MH and inhibin B. is normal.  She experienced postmenopausal bleeding in May 2016. FSH was elevated (postmenopausal) at 76, AMR is normal as was inhibin B. All within postmenopausal range. Transvaginal US on 03/19/15 showed a 6.6x3.1x4.1cm uterus with a 67mm endometrial stripe. The right ovary contained a simple cyst with benign features and measured 3.9x2.2x2.7cm.   Endometrial biopsy on June 16/2016 was benign (polyp).  Interval Hx:  She has had new spotting x 1 month.   TVUS with Nicole Beck on 11/06/15 showed a 6x2.7x4.5cm uterus with 2.87mm endometrial  Stripe and normal right ovary with stable simple cyst   Review of Systems:10 point review of systems is negative except as noted in interval history (abdominal pain).  Vitals: Blood pressure 122/83, pulse 99, temperature 98.5 F (36.9 C), temperature source Oral, resp. rate 18, height 5\' 7"  (1.702 m), weight 209 lb 1.6 oz (94.8 kg), SpO2 99 %.  Physical Exam: HEENT: Normal  Neck is supple without thyromegaly.  Abdomen is soft nontender no masses again a megaly or ascites are noted.  Pelvic exam  EGBUS normal  Vagina normal  Cervix normal.   Uterus is anterior  normal shape size consistency  Adnexa without masses  Rectovaginal exam confirms.  Lower extremities without edema or varicosities.     Allergies  Allergen Reactions  . Other Nausea And  Vomiting and Other (See Comments)    Real strong antibiotics  . Codeine Nausea And Vomiting    Past Medical History:  Diagnosis Date  . Diabetes mellitus without complication (HCC)    diet controlled  . Tachycardia     Past Surgical History:  Procedure Laterality Date  . CHOLECYSTECTOMY    . KNEE SURGERY Left   . left salpingoophorectomy Left   . TMJ ARTHROPLASTY Right   . TUBAL LIGATION      Current Outpatient Prescriptions  Medication Sig Dispense Refill  . albuterol (PROVENTIL) (2.5 MG/3ML) 0.083% nebulizer solution as needed.     . diltiazem (CARDIZEM CD) 120 MG 24 hr capsule Take 120 mg by mouth daily.    Marland Kitchen KLOR-CON M20 20 MEQ tablet Take 20 mEq by mouth daily.  3  . omeprazole (PRILOSEC) 20 MG capsule Take 20 mg by mouth daily.     No current facility-administered medications for this visit.     Social History   Social History  . Marital status: Married    Spouse name: N/A  . Number of children: N/A  . Years of education: N/A   Occupational History  . Not on file.   Social History Main Topics  . Smoking status: Never Smoker  . Smokeless tobacco: Never Used  . Alcohol use Not on file  . Drug use: Unknown  . Sexual activity: Yes   Other Topics Concern  . Not on file   Social History Narrative  . No narrative on file    Family History  Problem Relation Age of Onset  . Diabetes Mother   . Cancer Mother   . CAD Father   . Diabetes Sister   . CAD Sister   . Cancer Sister   . Diabetes Maternal Grandmother   . CAD Maternal Grandfather   . Cancer Maternal Aunt     PROCEDURE NOTE:  Endometrial Biopsy  The procedure was explained.  The speculum was placed and the cervix was grasped with tenaculum  The endometrial biopsy was performed with 1 passes of the endometrial biopsy pipelle. scant tissue was obtained. The uterus sounded to 6 cm.  The patient tolerated procedure     Nicole Eva, MD 09/15/2016, 11:15 AM

## 2016-09-15 NOTE — Patient Instructions (Signed)
Preparing for your Surgery  Plan for surgery on October 16, 2016 with Dr. Everitt Amber at [facility] will be scheduled for a robotic assisted total hysterectomy, right salpingo-oophorectomy.  We will call you with the results of your endometrial biopsy from today.  Pre-operative Testing -You will receive a phone call from presurgical testing at [facility] to arrange for a pre-operative testing appointment before your surgery.  This appointment normally occurs one to two weeks before your scheduled surgery.   -Bring your insurance card, copy of an advanced directive if applicable, medication list  -At that visit, you will be asked to sign a consent for a possible blood transfusion in case a transfusion becomes necessary during surgery.  The need for a blood transfusion is rare but having consent is a necessary part of your care.     -You should not be taking blood thinners or aspirin at least ten days prior to surgery unless instructed by your surgeon.  Day Before Surgery at [place] will be asked to take in a light diet the day before surgery.  Avoid carbonated beverages.  You will be advised to have nothing to eat or drink after midnight the evening before.     Eat a light diet the day before surgery.  Examples including soups, broths, toast, yogurt, mashed potatoes.  Things to avoid include carbonated beverages (fizzy beverages), raw fruits and raw vegetables, or beans.    If your bowels are filled with gas, your surgeon will have difficulty visualizing your pelvic organs which increases your surgical risks.  Your role in recovery Your role is to become active as soon as directed by your doctor, while still giving yourself time to heal.  Rest when you feel tired. You will be asked to do the following in order to speed your recovery:  - Cough and breathe deeply. This helps toclear and expand your lungs and can prevent pneumonia. You may be given a  spirometer to practice deep breathing. A staff member will show you how to use the spirometer. - Do mild physical activity. Walking or moving your legs help your circulation and body functions return to normal. A staff member will help you when you try to walk and will provide you with simple exercises. Do not try to get up or walk alone the first time. - Actively manage your pain. Managing your pain lets you move in comfort. We will ask you to rate your pain on a scale of zero to 10. It is your responsibility to tell your doctor or nurse where and how much you hurt so your pain can be treated.  Special Considerations -If you are diabetic, you may be placed on insulin after surgery to have closer control over your blood sugars to promote healing and recovery.  This does not mean that you will be discharged on insulin.  If applicable, your oral antidiabetics will be resumed when you are tolerating a solid diet.  -Your final pathology results from surgery should be available by the Friday after surgery and the results will be relayed to you when available.   Blood Transfusion Information WHAT IS A BLOOD TRANSFUSION? A transfusion is the replacement of blood or some of its parts. Blood is made up of multiple cells which provide different functions.  Red blood cells carry oxygen and are used for blood loss replacement.  White blood cells fight against infection.  Platelets control bleeding.  Plasma helps clot blood.  Other  blood products are available for specialized needs, such as hemophilia or other clotting disorders. BEFORE THE TRANSFUSION  Who gives blood for transfusions?   You may be able to donate blood to be used at a later date on yourself (autologous donation).  Relatives can be asked to donate blood. This is generally not any safer than if you have received blood from a stranger. The same precautions are taken to ensure safety when a relative's blood is donated.  Healthy  volunteers who are fully evaluated to make sure their blood is safe. This is blood bank blood. Transfusion therapy is the safest it has ever been in the practice of medicine. Before blood is taken from a donor, a complete history is taken to make sure that person has no history of diseases nor engages in risky social behavior (examples are intravenous drug use or sexual activity with multiple partners). The donor's travel history is screened to minimize risk of transmitting infections, such as malaria. The donated blood is tested for signs of infectious diseases, such as HIV and hepatitis. The blood is then tested to be sure it is compatible with you in order to minimize the chance of a transfusion reaction. If you or a relative donates blood, this is often done in anticipation of surgery and is not appropriate for emergency situations. It takes many days to process the donated blood. RISKS AND COMPLICATIONS Although transfusion therapy is very safe and saves many lives, the main dangers of transfusion include:   Getting an infectious disease.  Developing a transfusion reaction. This is an allergic reaction to something in the blood you were given. Every precaution is taken to prevent this. The decision to have a blood transfusion has been considered carefully by your caregiver before blood is given. Blood is not given unless the benefits outweigh the risks.

## 2016-09-18 ENCOUNTER — Telehealth: Payer: Self-pay | Admitting: *Deleted

## 2016-09-18 ENCOUNTER — Telehealth: Payer: Self-pay

## 2016-09-18 NOTE — Telephone Encounter (Signed)
Patient called wanting to know the results of her biopsy. Message forwarded.

## 2016-09-18 NOTE — Telephone Encounter (Signed)
Pt notified of endometrial bx results. Verbalized understanding.

## 2016-10-13 ENCOUNTER — Telehealth: Payer: Self-pay

## 2016-10-13 DIAGNOSIS — I4711 Inappropriate sinus tachycardia, so stated: Secondary | ICD-10-CM | POA: Insufficient documentation

## 2016-10-13 DIAGNOSIS — R000 Tachycardia, unspecified: Secondary | ICD-10-CM | POA: Insufficient documentation

## 2016-10-13 DIAGNOSIS — I4719 Other supraventricular tachycardia: Secondary | ICD-10-CM

## 2016-10-13 DIAGNOSIS — I471 Supraventricular tachycardia: Secondary | ICD-10-CM | POA: Insufficient documentation

## 2016-10-13 HISTORY — DX: Other supraventricular tachycardia: I47.19

## 2016-10-13 HISTORY — DX: Inappropriate sinus tachycardia, so stated: I47.11

## 2016-10-13 HISTORY — DX: Tachycardia, unspecified: R00.0

## 2016-10-13 NOTE — Patient Instructions (Addendum)
Nicole Beck  10/13/2016   Your procedure is scheduled on: 10/16/2016    Report to [facility] Main  Entrance take [place]  elevators to 3rd floor to  [place] at   0930 AM.  Call this number if you have problems the morning of surgery [phone]   Remember: ONLY 1 PERSON MAY GO WITH YOU TO SHORT STAY TO GET  READY MORNING OF [place].  Do not eat food or drink liquids :After Midnight.             Eat a light diet the day before surgery.  Examples include : soups, broths, toast, yogurt, and mashed potatoes.  Avoid carbonated beverages, raw fruits and vegetables and beans.      Take these medicines the morning of surgery with A SIP OF WATER: Diltiazem ( Cardiazem), Omeprazole ( Prilosec)                                You may not have any metal on your body including hair pins and              piercings  Do not wear jewelry, make-up, lotions, powders or perfumes, deodorant             Do not wear nail polish.  Do not shave  48 hours prior to surgery.     Do not bring valuables to the hospital. [place].  Contacts, dentures or bridgework may not be worn into surgery.  Leave suitcase in the car. After surgery it may be brought to your room.                 Special instructions; coughing and deep breathing exercises, leg exercises               Please read over the following fact sheets you were given: _____________________________________________________________________             [facility] - Preparing for Surgery Before surgery, you can play an important role.  Because skin is not sterile, your skin needs to be as free of germs as possible.  You can reduce the number of germs on your skin by washing with CHG (chlorahexidine gluconate) soap before surgery.  CHG is an antiseptic cleaner which kills germs and bonds with the skin to continue killing germs even after washing. Please DO NOT  use if you have an allergy to CHG or antibacterial soaps.  If your skin becomes reddened/irritated stop using the CHG and inform your nurse when you arrive at Short Stay. Do not shave (including legs and underarms) for at least 48 hours prior to the first CHG shower.  You may shave your face/neck. Please follow these instructions carefully:  1.  Shower with CHG Soap the night before surgery and the  morning of Surgery.  2.  If you choose to wash your hair, wash your hair first as usual with your  normal  shampoo.  3.  After you shampoo, rinse your hair and body thoroughly to remove the  shampoo.                           4.  Use  CHG as you would any other liquid soap.  You can apply chg directly  to the skin and wash                       Gently with a scrungie or clean washcloth.  5.  Apply the CHG Soap to your body ONLY FROM THE NECK DOWN.   Do not use on face/ open                           Wound or open sores. Avoid contact with eyes, ears mouth and genitals (private parts).                       Wash face,  Genitals (private parts) with your normal soap.             6.  Wash thoroughly, paying special attention to the area where your surgery  will be performed.  7.  Thoroughly rinse your body with warm water from the neck down.  8.  DO NOT shower/wash with your normal soap after using and rinsing off  the CHG Soap.                9.  Pat yourself dry with a clean towel.            10.  Wear clean pajamas.            11.  Place clean sheets on your bed the night of your first shower and do not  sleep with pets. Day of Surgery : Do not apply any lotions/deodorants the morning of surgery.  Please wear clean clothes to the hospital/surgery center.  FAILURE TO FOLLOW THESE INSTRUCTIONS MAY RESULT IN THE CANCELLATION OF YOUR SURGERY PATIENT SIGNATURE_________________________________  NURSE  SIGNATURE__________________________________  ________________________________________________________________________  WHAT IS A BLOOD TRANSFUSION? Blood Transfusion Information  A transfusion is the replacement of blood or some of its parts. Blood is made up of multiple cells which provide different functions.  Red blood cells carry oxygen and are used for blood loss replacement.  White blood cells fight against infection.  Platelets control bleeding.  Plasma helps clot blood.  Other blood products are available for specialized needs, such as hemophilia or other clotting disorders. BEFORE THE TRANSFUSION  Who gives blood for transfusions?   Healthy volunteers who are fully evaluated to make sure their blood is safe. This is blood bank blood. Transfusion therapy is the safest it has ever been in the practice of medicine. Before blood is taken from a donor, a complete history is taken to make sure that person has no history of diseases nor engages in risky social behavior (examples are intravenous drug use or sexual activity with multiple partners). The donor's travel history is screened to minimize risk of transmitting infections, such as malaria. The donated blood is tested for signs of infectious diseases, such as HIV and hepatitis. The blood is then tested to be sure it is compatible with you in order to minimize the chance of a transfusion reaction. If you or a relative donates blood, this is often done in anticipation of surgery and is not appropriate for emergency situations. It takes many days to process the donated blood. RISKS AND COMPLICATIONS Although transfusion therapy is very safe and saves many lives, the main dangers of transfusion include:   Getting an infectious disease.  Developing a transfusion reaction. This is an allergic  reaction to something in the blood you were given. Every precaution is taken to prevent this. The decision to have a blood transfusion has been  considered carefully by your caregiver before blood is given. Blood is not given unless the benefits outweigh the risks. AFTER THE TRANSFUSION  Right after receiving a blood transfusion, you will usually feel much better and more energetic. This is especially true if your red blood cells have gotten low (anemic). The transfusion raises the level of the red blood cells which carry oxygen, and this usually causes an energy increase.  The nurse administering the transfusion will monitor you carefully for complications. HOME CARE INSTRUCTIONS  No special instructions are needed after a transfusion. You may find your energy is better. Speak with your caregiver about any limitations on activity for underlying diseases you may have. SEEK MEDICAL CARE IF:   Your condition is not improving after your transfusion.  You develop redness or irritation at the intravenous (IV) site. SEEK IMMEDIATE MEDICAL CARE IF:  Any of the following symptoms occur over the next 12 hours:  Shaking chills.  You have a temperature by mouth above 102 F (38.9 C), not controlled by medicine.  Chest, back, or muscle pain.  People around you feel you are not acting correctly or are confused.  Shortness of breath or difficulty breathing.  Dizziness and fainting.  You get a rash or develop hives.  You have a decrease in urine output.  Your urine turns a dark color or changes to pink, red, or brown. Any of the following symptoms occur over the next 10 days:  You have a temperature by mouth above 102 F (38.9 C), not controlled by medicine.  Shortness of breath.  Weakness after normal activity.  The white part of the eye turns yellow (jaundice).  You have a decrease in the amount of urine or are urinating less often.  Your urine turns a dark color or changes to pink, red, or brown. Document Released: 09/12/2000 Document Revised: 12/08/2011 Document Reviewed: 05/01/2008 ExitCare Patient Information 2014  [place].  _______________________________________________________________________  Incentive Spirometer  An incentive spirometer is a tool that can help keep your lungs clear and active. This tool measures how well you are filling your lungs with each breath. Taking long deep breaths may help reverse or decrease the chance of developing breathing (pulmonary) problems (especially infection) following:  A long period of time when you are unable to move or be active. BEFORE THE PROCEDURE   If the spirometer includes an indicator to show your best effort, your nurse or respiratory therapist will set it to a desired goal.  If possible, sit up straight or lean slightly forward. Try not to slouch.  Hold the incentive spirometer in an upright position. INSTRUCTIONS FOR USE  1. Sit on the edge of your bed if possible, or sit up as far as you can in bed or on a chair. 2. Hold the incentive spirometer in an upright position. 3. Breathe out normally. 4. Place the mouthpiece in your mouth and seal your lips tightly around it. 5. Breathe in slowly and as deeply as possible, raising the piston or the ball toward the top of the column. 6. Hold your breath for 3-5 seconds or for as long as possible. Allow the piston or ball to fall to the bottom of the column. 7. Remove the mouthpiece from your mouth and breathe out normally. 8. Rest for a few seconds and repeat Steps 1 through 7 at  least 10 times every 1-2 hours when you are awake. Take your time and take a few normal breaths between deep breaths. 9. The spirometer may include an indicator to show your best effort. Use the indicator as a goal to work toward during each repetition. 10. After each set of 10 deep breaths, practice coughing to be sure your lungs are clear. If you have an incision (the cut made at the time of surgery), support your incision when coughing by placing a pillow or rolled up towels firmly against it. Once you are able to get  out of bed, walk around indoors and cough well. You may stop using the incentive spirometer when instructed by your caregiver.  RISKS AND COMPLICATIONS  Take your time so you do not get dizzy or light-headed.  If you are in pain, you may need to take or ask for pain medication before doing incentive spirometry. It is harder to take a deep breath if you are having pain. AFTER USE  Rest and breathe slowly and easily.  It can be helpful to keep track of a log of your progress. Your caregiver can provide you with a simple table to help with this. If you are using the spirometer at home, follow these instructions: [place] IF:   You are having difficultly using the spirometer.  You have trouble using the spirometer as often as instructed.  Your pain medication is not giving enough relief while using the spirometer.  You develop fever of 100.5 F (38.1 C) or higher. SEEK IMMEDIATE MEDICAL CARE IF:   You cough up bloody sputum that had not been present before.  You develop fever of 102 F (38.9 C) or greater.  You develop worsening pain at or near the incision site. MAKE SURE YOU:   Understand these instructions.  Will watch your condition.  Will get help right away if you are not doing well or get worse. Document Released: 01/26/2007 Document Revised: 12/08/2011 Document Reviewed: 03/29/2007 [facility] Patient Information 2014 [place], [place].   ________________________________________________________________________

## 2016-10-13 NOTE — Telephone Encounter (Signed)
Pt called to CANCEL surgical date 10-16-16. Stated she has a sinus infection and elevated HR 120-130's is going to see her cardiologist today. RN cancelled surgery and will notify Dr Dorann Ou.

## 2016-10-14 ENCOUNTER — Inpatient Hospital Stay (HOSPITAL_COMMUNITY)
Admission: RE | Admit: 2016-10-14 | Discharge: 2016-10-14 | Disposition: A | Discharge status: Home / Self Care | Payer: Self-pay | Source: Ambulatory Visit

## 2016-10-16 ENCOUNTER — Encounter (HOSPITAL_COMMUNITY): Admission: RE | Payer: Self-pay | Source: Ambulatory Visit

## 2016-10-16 ENCOUNTER — Ambulatory Visit (HOSPITAL_COMMUNITY)
Admission: RE | Admit: 2016-10-16 | Payer: BLUE CROSS/BLUE SHIELD | Source: Ambulatory Visit | Admitting: Gynecologic Oncology

## 2016-10-16 SURGERY — HYSTERECTOMY, TOTAL, ROBOT-ASSISTED, LAPAROSCOPIC, WITH BILATERAL SALPINGO-OOPHORECTOMY
Anesthesia: General | Laterality: Right

## 2017-06-16 DIAGNOSIS — Z86711 Personal history of pulmonary embolism: Secondary | ICD-10-CM

## 2017-06-16 DIAGNOSIS — R945 Abnormal results of liver function studies: Secondary | ICD-10-CM

## 2017-06-16 DIAGNOSIS — Z9221 Personal history of antineoplastic chemotherapy: Secondary | ICD-10-CM

## 2017-06-16 DIAGNOSIS — R109 Unspecified abdominal pain: Secondary | ICD-10-CM

## 2017-06-16 DIAGNOSIS — Z8543 Personal history of malignant neoplasm of ovary: Secondary | ICD-10-CM

## 2017-06-16 DIAGNOSIS — E876 Hypokalemia: Secondary | ICD-10-CM

## 2017-10-13 DIAGNOSIS — Z8543 Personal history of malignant neoplasm of ovary: Secondary | ICD-10-CM | POA: Diagnosis not present

## 2017-10-13 DIAGNOSIS — Z86711 Personal history of pulmonary embolism: Secondary | ICD-10-CM | POA: Diagnosis not present

## 2017-10-13 DIAGNOSIS — R1011 Right upper quadrant pain: Secondary | ICD-10-CM | POA: Diagnosis not present

## 2017-10-13 DIAGNOSIS — Z9221 Personal history of antineoplastic chemotherapy: Secondary | ICD-10-CM | POA: Diagnosis not present

## 2017-10-13 DIAGNOSIS — E876 Hypokalemia: Secondary | ICD-10-CM | POA: Diagnosis not present

## 2017-10-13 DIAGNOSIS — R945 Abnormal results of liver function studies: Secondary | ICD-10-CM | POA: Diagnosis not present

## 2018-05-03 ENCOUNTER — Ambulatory Visit: Payer: BLUE CROSS/BLUE SHIELD | Admitting: Cardiology

## 2018-05-03 ENCOUNTER — Encounter: Payer: Self-pay | Admitting: Cardiology

## 2018-05-03 VITALS — BP 120/78 | Ht 67.0 in | Wt 209.0 lb

## 2018-05-03 DIAGNOSIS — R0789 Other chest pain: Secondary | ICD-10-CM | POA: Diagnosis not present

## 2018-05-03 DIAGNOSIS — R000 Tachycardia, unspecified: Secondary | ICD-10-CM

## 2018-05-03 DIAGNOSIS — I471 Supraventricular tachycardia: Secondary | ICD-10-CM

## 2018-05-03 DIAGNOSIS — R002 Palpitations: Secondary | ICD-10-CM | POA: Diagnosis not present

## 2018-05-03 DIAGNOSIS — R0602 Shortness of breath: Secondary | ICD-10-CM

## 2018-05-03 LAB — BASIC METABOLIC PANEL
BUN/Creatinine Ratio: 14 (ref 9–23)
BUN: 13 mg/dL (ref 6–24)
CO2: 24 mmol/L (ref 20–29)
Calcium: 9.2 mg/dL (ref 8.7–10.2)
Chloride: 104 mmol/L (ref 96–106)
Creatinine, Ser: 0.96 mg/dL (ref 0.57–1.00)
GFR calc Af Amer: 80 mL/min/{1.73_m2} (ref >59)
GFR calc non Af Amer: 70 mL/min/{1.73_m2} (ref >59)
Glucose: 117 mg/dL — ABNORMAL HIGH (ref 65–99)
Potassium: 4.1 mmol/L (ref 3.5–5.2)
Sodium: 141 mmol/L (ref 134–144)

## 2018-05-03 MED ORDER — METOPROLOL TARTRATE 25 MG PO TABS: 25.0000 mg | ORAL_TABLET | Freq: Two times a day (BID) | ORAL | 2 refills | Status: DC

## 2018-05-03 MED ORDER — KLOR-CON M20 20 MEQ PO TBCR: 20.0000 meq | EXTENDED_RELEASE_TABLET | Freq: Every day | ORAL | 2 refills | Status: DC | PRN

## 2018-05-03 NOTE — Progress Notes (Signed)
Cardiology Office Note:    Date:  05/03/2018   ID:  Nicole Beck, DOB August 18, 1966, MRN [id number]  PCP:  Greig Right, MD  Cardiologist:  Jenne Campus, MD    Referring MD: Greig Right, MD   Chief Complaint  Patient presents with  . Annual Exam  Doing well  History of Present Illness:    Nicole Beck is a 52 y.o. female with palpitations, also atrial tachycardia as well as long QT interval.  She was referred to [facility] for EP evaluation after she got EP study done by Dr. Karel Jarvis.  He identified arrhythmia that she suffered from however he was not able to ablate this because of proximity of the nerve.  She was referred to [facility] she was put on medical therapy and seems to be doing very well since that time denies having any palpitations.  No passing out no dizziness very active walking on the beach all time and have no difficulty doing it.  Overall she is very happy the way she feels  Past Medical History:  Diagnosis Date  . Diabetes mellitus without complication (HCC)    diet controlled  . Tachycardia     Past Surgical History:  Procedure Laterality Date  . CHOLECYSTECTOMY    . KNEE SURGERY Left   . left salpingoophorectomy Left   . TMJ ARTHROPLASTY Right   . TUBAL LIGATION      Current Medications: Current Meds  Medication Sig  . ibuprofen (ADVIL,MOTRIN) 200 MG tablet Take 400 mg by mouth every 6 (six) hours as needed for headache.  Marland Kitchen KLOR-CON M20 20 MEQ tablet Take 20 mEq by mouth daily as needed (feels tired).   . metoprolol tartrate (LOPRESSOR) 25 MG tablet Take 1 tablet by mouth 2 (two) times daily.     Allergies:   Other and Codeine   Social History   Socioeconomic History  . Marital status: Married    Spouse name: Not on file  . Number of children: Not on file  . Years of education: Not on file  . Highest education level: Not on file  Occupational History  . Not on file  Social Needs  . Financial resource strain: Not  on file  . Food insecurity:    Worry: Not on file    Inability: Not on file  . Transportation needs:    Medical: Not on file    Non-medical: Not on file  Tobacco Use  . Smoking status: Never Smoker  . Smokeless tobacco: Never Used  Substance and Sexual Activity  . Alcohol use: Never    Frequency: Never  . Drug use: Never  . Sexual activity: Yes  Lifestyle  . Physical activity:    Days per week: Not on file    Minutes per session: Not on file  . Stress: Not on file  Relationships  . Social connections:    Talks on phone: Not on file    Gets together: Not on file    Attends religious service: Not on file    Active member of club or organization: Not on file    Attends meetings of clubs or organizations: Not on file    Relationship status: Not on file  Other Topics Concern  . Not on file  Social History Narrative  . Not on file     Family History: The patient's family history includes CAD in her father, maternal grandfather, and sister; Cancer in her maternal aunt, mother, and sister; Diabetes  in her maternal grandmother, mother, and sister. ROS:   Please see the history of present illness.    All 14 point review of systems negative except as described per history of present illness  EKGs/Labs/Other Studies Reviewed:      Recent Labs: No results found for requested labs within last 8760 hours.  Recent Lipid Panel No results found for: CHOL, TRIG, HDL, CHOLHDL, VLDL, LDLCALC, LDLDIRECT  Physical Exam:    VS:  BP 120/78 (BP Location: Right Arm, Patient Position: Sitting, Cuff Size: Normal)   Ht 5\' 7"  (1.702 m)   Wt 209 lb (94.8 kg)   BMI 32.73 kg/m     Wt Readings from Last 3 Encounters:  05/03/18 209 lb (94.8 kg)  09/15/16 209 lb 1.6 oz (94.8 kg)  10/29/15 219 lb 8 oz (99.6 kg)     GEN:  Well nourished, well developed in no acute distress HEENT: Normal NECK: No JVD; No carotid bruits LYMPHATICS: No lymphadenopathy CARDIAC: RRR, no murmurs, no rubs, no  gallops RESPIRATORY:  Clear to auscultation without rales, wheezing or rhonchi  ABDOMEN: Soft, non-tender, non-distended MUSCULOSKELETAL:  No edema; No deformity  SKIN: Warm and dry LOWER EXTREMITIES: no swelling NEUROLOGIC:  Alert and oriented x 3 PSYCHIATRIC:  Normal affect   ASSESSMENT:    1. Atrial tachycardia ([place])   2. Inappropriate sinus tachycardia   3. Palpitations   4. Atypical chest pain   5. Shortness of breath    PLAN:    In order of problems listed above:  1. Atrial tachycardia doing well with medications we will continue present management. 2. Prolonged QT interval QT interval on EKG today is normal.  We will continue present management. 3. History of hypokalemia I will ask her to have Chem-7 done today 4. Chest pain denies having any. 5. Shortness of breath denies having any.  See her back 6 months or sooner if she get a problem   Medication Adjustments/Labs and Tests Ordered: Current medicines are reviewed at length with the patient today.  Concerns regarding medicines are outlined above.  No orders of the defined types were placed in this encounter.  Medication changes: No orders of the defined types were placed in this encounter.   Signed, Park Liter, MD, [facility] 05/03/2018 9:57 AM    [place]

## 2018-05-03 NOTE — Patient Instructions (Signed)
Medication Instructions:  Your physician recommends that you continue on your current medications as directed. Please refer to the Current Medication list given to you today.  Labwork: Your physician recommends that you have the following labs drawn: BMP today  Testing/Procedures: None  Follow-Up: Your physician recommends that you schedule a follow-up appointment in: 6 months  Any Other Special Instructions Will Be Listed Below (If Applicable).     If you need a refill on your cardiac medications before your next appointment, please call your pharmacy.   [place]andy, RN, BSN

## 2018-10-20 DIAGNOSIS — Z8543 Personal history of malignant neoplasm of ovary: Secondary | ICD-10-CM

## 2018-12-22 ENCOUNTER — Telehealth: Payer: Self-pay | Admitting: Cardiology

## 2018-12-22 NOTE — Telephone Encounter (Signed)
Left message for patient to return call.

## 2018-12-22 NOTE — Telephone Encounter (Signed)
Patient reports she has tachycardia and her anxiety is off the charts with everything going on. She is asking for something for her anxiety. Please advise.

## 2018-12-24 NOTE — Telephone Encounter (Signed)
Called patient back. She reports she had food poisoning on Monday after eating bad chicken, which caused her heart rate and anxiety to increase. However she reports she is feeling fine now, her heart rate is not high and anxiety is better. She will monitor and call us if any thing changes.

## 2019-02-08 NOTE — Telephone Encounter (Signed)
error 

## 2019-02-10 ENCOUNTER — Telehealth: Payer: Self-pay | Admitting: Cardiology

## 2019-02-10 ENCOUNTER — Other Ambulatory Visit: Payer: Self-pay | Admitting: *Deleted

## 2019-02-10 MED ORDER — METOPROLOL TARTRATE 25 MG PO TABS: 25.0000 mg | ORAL_TABLET | Freq: Two times a day (BID) | ORAL | 0 refills | Status: DC

## 2019-02-10 NOTE — Telephone Encounter (Signed)
°*  STAT* If patient is at the pharmacy, call can be transferred to refill team.   1. Which medications need to be refilled? (please list name of each medication and dose if known) metoprolol ttartrate 25mg  tablet  2. Which pharmacy/location (including street and city if local pharmacy) [facility] [address]   3. Do they need a 30 day or 90 day supply? [place]

## 2019-02-10 NOTE — Telephone Encounter (Signed)
[facility] [address] [facility]. Note left to make appointment for further refills

## 2019-02-14 ENCOUNTER — Telehealth: Payer: Self-pay | Admitting: *Deleted

## 2019-02-14 NOTE — Telephone Encounter (Signed)
Returned the patient's call, Patient called earlier and left a message.Patient stated I have some dischrage should I come see her or wait until Dr, Elizebeth Koller does a scan. Called the patient, she stated that "I have had some discharge a month ago, but it went away in a day or two. Then it started back yesterday, it's like old brown blood, like when you start your period. Some cramping too. But I know I can't have a period, I should be menopausal." Per Melissa APP explained to the patient to let Dr. Hinton Rao do the scan and call us if it showed anything. Patient verbalized understanding

## 2019-02-14 NOTE — Telephone Encounter (Signed)
Incoming call from Emet APP at Dr. Remi Deter office, she stated "The patient called and stated that your office said for her to call us and schedule/order CT scan." Explained that the patient said "I was due for a scan at Dr.McCarty's office." that is why she referred her back there. Claiborne Billings APP stated "that the patient is not due for a scan and they are not set up to check her vaginal discharge. Do I need to have her see her regular GYN" Explained that I would give the message to [facility] APP and call the patient tomorrow. Claiborne Billings APP requested that I call her and she will call the patient.

## 2019-02-15 ENCOUNTER — Telehealth: Payer: Self-pay | Admitting: *Deleted

## 2019-02-15 NOTE — Telephone Encounter (Signed)
Called the patient and explained per Dr. Denman George and Lenna Sciara APP she needs to follow up with GYN for the vaginal discharge.

## 2019-02-24 ENCOUNTER — Encounter: Payer: Self-pay | Admitting: Cardiology

## 2019-02-24 ENCOUNTER — Telehealth (INDEPENDENT_AMBULATORY_CARE_PROVIDER_SITE_OTHER): Payer: BLUE CROSS/BLUE SHIELD | Admitting: Cardiology

## 2019-02-24 ENCOUNTER — Other Ambulatory Visit: Payer: Self-pay

## 2019-02-24 DIAGNOSIS — R0789 Other chest pain: Secondary | ICD-10-CM

## 2019-02-24 DIAGNOSIS — I471 Supraventricular tachycardia: Secondary | ICD-10-CM

## 2019-02-24 DIAGNOSIS — R000 Tachycardia, unspecified: Secondary | ICD-10-CM

## 2019-02-24 DIAGNOSIS — R002 Palpitations: Secondary | ICD-10-CM

## 2019-02-24 NOTE — Patient Instructions (Signed)
Medication Instructions:  Your physician recommends that you continue on your current medications as directed. Please refer to the Current Medication list given to you today.  If you need a refill on your cardiac medications before your next appointment, please call your pharmacy.   Lab work: None.   If you have labs (blood work) drawn today and your tests are completely normal, you will receive your results only by: . MyChart Message (if you have MyChart) OR . A paper copy in the mail If you have any lab test that is abnormal or we need to change your treatment, we will call you to review the results.  Testing/Procedures: None.   Follow-Up: At [facility], you and your health needs are our priority.  As part of our continuing mission to provide you with exceptional heart care, we have created designated Provider Care Teams.  These Care Teams include your primary Cardiologist (physician) and Advanced Practice Providers (APPs -  Physician Assistants and Nurse Practitioners) who all work together to provide you with the care you need, when you need it. You will need a follow up appointment in 6 months.  Please call our office 2 months in advance to schedule this appointment.  You may see No primary care provider on file. or another member of our [facility] Provider Team in [place]: Brian Munley, MD . Rajan Revankar, MD  Any Other Special Instructions Will Be Listed Below (If Applicable).    

## 2019-02-24 NOTE — Progress Notes (Signed)
Virtual Visit via Video Note   This visit type was conducted due to national recommendations for restrictions regarding the COVID-19 Pandemic (e.g. social distancing) in an effort to limit this patient's exposure and mitigate transmission in our community.  Due to her co-morbid illnesses, this patient is at least at moderate risk for complications without adequate follow up.  This format is felt to be most appropriate for this patient at this time.  All issues noted in this document were discussed and addressed.  A limited physical exam was performed with this format.  Please refer to the patient's chart for her consent to telehealth for [facility].  Evaluation Performed:  Follow-up visit  This visit type was conducted due to national recommendations for restrictions regarding the COVID-19 Pandemic (e.g. social distancing).  This format is felt to be most appropriate for this patient at this time.  All issues noted in this document were discussed and addressed.  No physical exam was performed (except for noted visual exam findings with Video Visits).  Please refer to the patient's chart (MyChart message for video visits and phone note for telephone visits) for the patient's consent to telehealth for [facility].  Date:  02/24/2019  ID: Nicole Beck, DOB [birth year], MRN [id number]   Patient Location: [address] [place] [address]   Provider location:   [place] Office  PCP:  Greig Right, MD  Cardiologist:  Jenne Campus, MD     Chief Complaint: Doing well  History of Present Illness:    Nicole Beck is a 53 y.o. female  who presents via audio/video conferencing for a telehealth visit today.  With palpitation, also history of prolonged QT.  Also atrial tachycardia.  There was attempt to ablate her however it was unsuccessful.  She is taking small dose of metoprolol and seems to be doing well from that point review.  Reports to have some  nightmares sometimes when she wakes up sometimes in the middle of night with her heart going very fast.  We talked about what to do with the situation I was talking about potentially taking an hour monitor on.  However she does not want to do it we talked about potentially buying device that will allow her to record EKG and she is thinking about that.  Otherwise doing well does have a place in a camper on the beach in [place] that she goes there quite regularly.  She likes to fish over there.  She is very busy and works a lot on [employer].   The patient does not have symptoms concerning for COVID-19 infection (fever, chills, cough, or new SHORTNESS OF BREATH).    Prior CV studies:   The following studies were reviewed today:       Past Medical History:  Diagnosis Date   Diabetes mellitus without complication ([place])    diet controlled   Tachycardia     Past Surgical History:  Procedure Laterality Date   CHOLECYSTECTOMY     KNEE SURGERY Left    left salpingoophorectomy Left    TMJ ARTHROPLASTY Right    TUBAL LIGATION       Current Meds  Medication Sig   metoprolol tartrate (LOPRESSOR) 25 MG tablet Take 1 tablet (25 mg total) by mouth 2 (two) times daily.      Family History: The patient's family history includes CAD in her father, maternal grandfather, and sister; Cancer in her maternal aunt, mother, and sister; Diabetes  in her maternal grandmother, mother, and sister.   ROS:   Please see the history of present illness.     All other systems reviewed and are negative.   Labs/Other Tests and Data Reviewed:     Recent Labs: 05/03/2018: BUN 13; Creatinine, Ser 0.96; Potassium 4.1; Sodium 141  Recent Lipid Panel No results found for: CHOL, TRIG, HDL, CHOLHDL, VLDL, LDLCALC, LDLDIRECT    Exam:    Vital Signs:  There were no vitals taken for this visit.    Wt Readings from Last 3 Encounters:  05/03/18 209 lb (94.8 kg)  09/15/16 209 lb 1.6 oz (94.8 kg)    10/29/15 219 lb 8 oz (99.6 kg)     Well nourished, well developed in no acute distress. Alert awake oriented x3 we started with video link however we started having some technical difficulties and we end up finishing with phone conversation.  Not in any distress sitting in the car while talking to me  Diagnosis for this visit:   1. Atrial tachycardia ([place])   2. Inappropriate sinus tachycardia   3. Palpitations   4. Atypical chest pain      ASSESSMENT & PLAN:    1.  Atrial tachycardia denies having any frequent episodes and happy the way she feels I told her if it bothers her more she need to let me know. 2.  Inappropriate sinus tachycardia not better too much with this small dose of beta-blocker which I will continue. 3.  Palpitations.  Noted as above. Atypical chest pain denies having any  COVID-19 Education: The signs and symptoms of COVID-19 were discussed with the patient and how to seek care for testing (follow up with PCP or arrange E-visit).  The importance of social distancing was discussed today.  Patient Risk:   After full review of this patients clinical status, I feel that they are at least moderate risk at this time.  Time:   Today, I have spent 16 minutes with the patient with telehealth technology discussing pt health issues.  I spent 5 minutes reviewing her chart before the visit.  Visit was finished at 11:07 AM.    Medication Adjustments/Labs and Tests Ordered: Current medicines are reviewed at length with the patient today.  Concerns regarding medicines are outlined above.  No orders of the defined types were placed in this encounter.  Medication changes: No orders of the defined types were placed in this encounter.    Disposition: Follow-up in 6 months  Signed, Park Liter, MD, [facility] 02/24/2019 11:09 AM    [place]

## 2019-04-19 DIAGNOSIS — D508 Other iron deficiency anemias: Secondary | ICD-10-CM

## 2019-04-19 DIAGNOSIS — Z7901 Long term (current) use of anticoagulants: Secondary | ICD-10-CM

## 2019-04-19 DIAGNOSIS — I2699 Other pulmonary embolism without acute cor pulmonale: Secondary | ICD-10-CM

## 2019-04-19 DIAGNOSIS — C569 Malignant neoplasm of unspecified ovary: Secondary | ICD-10-CM

## 2019-05-05 ENCOUNTER — Other Ambulatory Visit: Payer: Self-pay | Admitting: Cardiology

## 2019-08-07 ENCOUNTER — Other Ambulatory Visit: Payer: Self-pay | Admitting: Cardiology

## 2019-08-09 NOTE — Telephone Encounter (Signed)
[facility] [address] [facility]

## 2019-08-23 ENCOUNTER — Encounter: Payer: Self-pay | Admitting: Cardiology

## 2019-08-23 ENCOUNTER — Other Ambulatory Visit: Payer: Self-pay

## 2019-08-23 ENCOUNTER — Ambulatory Visit (INDEPENDENT_AMBULATORY_CARE_PROVIDER_SITE_OTHER): Payer: Self-pay | Admitting: Cardiology

## 2019-08-23 VITALS — BP 124/80 | HR 77 | Ht 68.0 in | Wt 210.0 lb

## 2019-08-23 DIAGNOSIS — R002 Palpitations: Secondary | ICD-10-CM

## 2019-08-23 DIAGNOSIS — R000 Tachycardia, unspecified: Secondary | ICD-10-CM

## 2019-08-23 DIAGNOSIS — R0789 Other chest pain: Secondary | ICD-10-CM

## 2019-08-23 NOTE — Progress Notes (Signed)
Cardiology Office Note:    Date:  08/23/2019   ID:  Nicole Beck, DOB [birth year], MRN [id number]  PCP:  Greig Right, MD  Cardiologist:  Jenne Campus, MD    Referring MD: Greig Right, MD   Chief Complaint  Patient presents with  . Follow-up  Doing well  History of Present Illness:    Nicole Beck is a 53 y.o. female with history of inappropriate sinus tachycardia, palpitations, overall seems to be doing well.  Denies having any issue enjoying fishing at her place in [place].  Is able to walk climb stairs with no difficulties.  Overall doing well  Past Medical History:  Diagnosis Date  . Diabetes mellitus without complication (HCC)    diet controlled  . Tachycardia     Past Surgical History:  Procedure Laterality Date  . CHOLECYSTECTOMY    . KNEE SURGERY Left   . left salpingoophorectomy Left   . TMJ ARTHROPLASTY Right   . TUBAL LIGATION      Current Medications: Current Meds  Medication Sig  . ibuprofen (ADVIL,MOTRIN) 200 MG tablet Take 400 mg by mouth every 6 (six) hours as needed for headache.  . metoprolol tartrate (LOPRESSOR) 25 MG tablet TAKE 1 TABLET BY MOUTH TWICE A DAY     Allergies:   Other and Codeine   Social History   Socioeconomic History  . Marital status: Married    Spouse name: Not on file  . Number of children: Not on file  . Years of education: Not on file  . Highest education level: Not on file  Occupational History  . Not on file  Social Needs  . Financial resource strain: Not on file  . Food insecurity    Worry: Not on file    Inability: Not on file  . Transportation needs    Medical: Not on file    Non-medical: Not on file  Tobacco Use  . Smoking status: Never Smoker  . Smokeless tobacco: Never Used  Substance and Sexual Activity  . Alcohol use: Never    Frequency: Never  . Drug use: Never  . Sexual activity: Yes  Lifestyle  . Physical activity    Days per week: Not on file    Minutes per session:  Not on file  . Stress: Not on file  Relationships  . Social Herbalist on phone: Not on file    Gets together: Not on file    Attends religious service: Not on file    Active member of club or organization: Not on file    Attends meetings of clubs or organizations: Not on file    Relationship status: Not on file  Other Topics Concern  . Not on file  Social History Narrative  . Not on file     Family History: The patient's family history includes CAD in her father, maternal grandfather, and sister; Cancer in her maternal aunt, mother, and sister; Diabetes in her maternal grandmother, mother, and sister. ROS:   Please see the history of present illness.    All 14 point review of systems negative except as described per history of present illness  EKGs/Labs/Other Studies Reviewed:      Recent Labs: No results found for requested labs within last 8760 hours.  Recent Lipid Panel No results found for: CHOL, TRIG, HDL, CHOLHDL, VLDL, LDLCALC, LDLDIRECT  Physical Exam:    VS:  BP 124/80   Pulse 77   Ht 5\' 8"  (1.727  m)   Wt 210 lb (95.3 kg)   SpO2 97%   BMI 31.93 kg/m     Wt Readings from Last 3 Encounters:  08/23/19 210 lb (95.3 kg)  05/03/18 209 lb (94.8 kg)  09/15/16 209 lb 1.6 oz (94.8 kg)     GEN:  Well nourished, well developed in no acute distress HEENT: Normal NECK: No JVD; No carotid bruits LYMPHATICS: No lymphadenopathy CARDIAC: RRR, no murmurs, no rubs, no gallops RESPIRATORY:  Clear to auscultation without rales, wheezing or rhonchi  ABDOMEN: Soft, non-tender, non-distended MUSCULOSKELETAL:  No edema; No deformity  SKIN: Warm and dry LOWER EXTREMITIES: no swelling NEUROLOGIC:  Alert and oriented x 3 PSYCHIATRIC:  Normal affect   ASSESSMENT:    1. Inappropriate sinus tachycardia   2. Palpitations   3. Atypical chest pain    PLAN:    In order of problems listed above:  1. Inappropriate sinus tachycardia no travel she is taking small  dose of beta-blocker which seems to be helping. 2. Palpitations denies having any on metoprolol which I will continue 3. Atypical chest pain denies having any.   Medication Adjustments/Labs and Tests Ordered: Current medicines are reviewed at length with the patient today.  Concerns regarding medicines are outlined above.  No orders of the defined types were placed in this encounter.  Medication changes: No orders of the defined types were placed in this encounter.   Signed, Park Liter, MD, [facility] 08/23/2019 11:14 AM    [place]

## 2019-08-23 NOTE — Patient Instructions (Signed)

## 2019-10-10 ENCOUNTER — Other Ambulatory Visit: Payer: Self-pay | Admitting: Cardiology

## 2019-11-22 DIAGNOSIS — C569 Malignant neoplasm of unspecified ovary: Secondary | ICD-10-CM

## 2020-02-26 ENCOUNTER — Other Ambulatory Visit: Payer: Self-pay | Admitting: Cardiology

## 2020-04-19 ENCOUNTER — Ambulatory Visit: Payer: Self-pay | Admitting: Cardiology

## 2020-05-21 ENCOUNTER — Other Ambulatory Visit: Payer: Self-pay | Admitting: Cardiology

## 2020-05-21 ENCOUNTER — Ambulatory Visit: Payer: Self-pay | Admitting: Cardiology

## 2020-06-14 ENCOUNTER — Telehealth: Payer: Self-pay | Admitting: Cardiology

## 2020-06-14 NOTE — Telephone Encounter (Signed)
Pt called and stated that she wanted to speak with Dr. Agustin Cree regarding the vaccine. Says she has some questions she wants to ask regarding her heart and wants some advice about she should do. Please call back

## 2020-06-14 NOTE — Telephone Encounter (Signed)
Pt called and she is having anxiety about getting the Covid vaccine... I reassured her that [facility] is supporting all of our patients to get the vaccine.... she may be more at risk of having complications from having the virus unvaccinated but she would feel better knowing that Dr. Agustin Cree review her message and provide his recommendations.    Will ofrward to Dr. Agustin Cree for review.

## 2020-06-15 NOTE — Telephone Encounter (Signed)
Patient was following up on her call from yesterday about her concerns regarding the vaccine. The patient did not get a phone call yesterday afternoon

## 2020-06-18 NOTE — Telephone Encounter (Signed)
Yes, she needs  to get vaccine

## 2020-06-18 NOTE — Telephone Encounter (Signed)
Called patient informed her that Dr. Agustin Cree recommends that she get the covid vaccine. She verbally understood no further questions.

## 2020-06-18 NOTE — Telephone Encounter (Signed)
° °  Forward to [facility]

## 2020-06-18 NOTE — Telephone Encounter (Signed)
    Pt is calling back to follow up  

## 2020-08-03 ENCOUNTER — Other Ambulatory Visit: Payer: Self-pay

## 2020-08-03 DIAGNOSIS — R000 Tachycardia, unspecified: Secondary | ICD-10-CM | POA: Insufficient documentation

## 2020-08-03 DIAGNOSIS — E119 Type 2 diabetes mellitus without complications: Secondary | ICD-10-CM | POA: Insufficient documentation

## 2020-08-06 ENCOUNTER — Ambulatory Visit (INDEPENDENT_AMBULATORY_CARE_PROVIDER_SITE_OTHER): Payer: Self-pay | Admitting: Cardiology

## 2020-08-06 ENCOUNTER — Encounter: Payer: Self-pay | Admitting: Cardiology

## 2020-08-06 ENCOUNTER — Other Ambulatory Visit: Payer: Self-pay

## 2020-08-06 VITALS — BP 110/90 | HR 84 | Ht 68.0 in | Wt 209.0 lb

## 2020-08-06 DIAGNOSIS — R0602 Shortness of breath: Secondary | ICD-10-CM

## 2020-08-06 DIAGNOSIS — R002 Palpitations: Secondary | ICD-10-CM

## 2020-08-06 DIAGNOSIS — E119 Type 2 diabetes mellitus without complications: Secondary | ICD-10-CM

## 2020-08-06 DIAGNOSIS — R0789 Other chest pain: Secondary | ICD-10-CM

## 2020-08-06 DIAGNOSIS — R000 Tachycardia, unspecified: Secondary | ICD-10-CM

## 2020-08-06 NOTE — Progress Notes (Signed)
Cardiology Office Note:    Date:  08/06/2020   ID:  Nicole Beck, DOB 1966-08-29, MRN [id number]  PCP:  Greig Right, MD  Cardiologist:  Jenne Campus, MD    Referring MD: Greig Right, MD   Chief Complaint  Patient presents with  . Follow-up  Doing fine  History of Present Illness:    Nicole Beck is a 54 y.o. female with diagnosis of inappropriate sinus tachycardia, palpitations, dyspnea on exertion, atypical chest pain.  Comes today 2 months for follow-up overall doing well denies having issue except described 1 episode of fluttering when she feels some twinges of her left breast.  Does not exercise on the regular basis she complained of being weak tired and exhausted.  She complained of having pains in her legs and she thinks is related to chemotherapy that she got few years ago.  She is worried a lot about her husband who is an alcoholic he is complaining of having chest pain.  Past Medical History:  Diagnosis Date  . Atypical chest pain 07/02/2015  . Diabetes mellitus without complication (HCC)    diet controlled  . 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 Inappropriate sinus tachycardia 10/13/2016  . Palpitations 07/02/2015  . Postmenopausal bleeding 09/15/2016  . Shortness of breath 07/02/2015  . Tachycardia     Past Surgical History:  Procedure Laterality Date  . CHOLECYSTECTOMY    . KNEE SURGERY Left   . left salpingoophorectomy Left   . TMJ ARTHROPLASTY Right   . TUBAL LIGATION      Current Medications: Current Meds  Medication Sig  . ibuprofen (ADVIL,MOTRIN) 200 MG tablet Take 400 mg by mouth every 6 (six) hours as needed for headache.  . metFORMIN (GLUCOPHAGE) 500 MG tablet Take 500 mg by mouth 2 (two) times daily.  . metoprolol tartrate (LOPRESSOR) 25 MG tablet TAKE 1 TABLET BY MOUTH TWICE A DAY     Allergies:   Other and Codeine   Social History   Socioeconomic History  . Marital status: Married    Spouse name: Not on  file  . Number of children: Not on file  . Years of education: Not on file  . Highest education level: Not on file  Occupational History  . Not on file  Tobacco Use  . Smoking status: Never Smoker  . Smokeless tobacco: Never Used  Substance and Sexual Activity  . Alcohol use: Never  . Drug use: Never  . Sexual activity: Yes  Other Topics Concern  . Not on file  Social History Narrative  . Not on file   Social Determinants of Health   Financial Resource Strain:   . Difficulty of Paying Living Expenses: Not on file  Food Insecurity:   . Worried About Charity fundraiser in the Last Year: Not on file  . Ran Out of Food in the Last Year: Not on file  Transportation Needs:   . Lack of Transportation (Medical): Not on file  . Lack of Transportation (Non-Medical): Not on file  Physical Activity:   . Days of Exercise per Week: Not on file  . Minutes of Exercise per Session: Not on file  Stress:   . Feeling of Stress : Not on file  Social Connections:   . Frequency of Communication with Friends and Family: Not on file  . Frequency of Social Gatherings with Friends and Family: Not on file  . Attends Religious Services: Not on file  . Active Member of  Clubs or Organizations: Not on file  . Attends Archivist Meetings: Not on file  . Marital Status: Not on file     Family History: The patient's family history includes CAD in her father, maternal grandfather, and sister; Cancer in her maternal aunt, mother, and sister; Diabetes in her maternal grandmother, mother, and sister. ROS:   Please see the history of present illness.    All 14 point review of systems negative except as described per history of present illness  EKGs/Labs/Other Studies Reviewed:      Recent Labs: No results found for requested labs within last 8760 hours.  Recent Lipid Panel No results found for: CHOL, TRIG, HDL, CHOLHDL, VLDL, LDLCALC, LDLDIRECT  Physical Exam:    VS:  BP 110/90 (BP  Location: Left Arm, Patient Position: Sitting, Cuff Size: Large)   Pulse 84   Ht 5\' 8"  (1.727 m)   Wt 209 lb (94.8 kg)   SpO2 98%   BMI 31.78 kg/m     Wt Readings from Last 3 Encounters:  08/06/20 209 lb (94.8 kg)  08/23/19 210 lb (95.3 kg)  05/03/18 209 lb (94.8 kg)     GEN:  Well nourished, well developed in no acute distress HEENT: Normal NECK: No JVD; No carotid bruits LYMPHATICS: No lymphadenopathy CARDIAC: RRR, no murmurs, no rubs, no gallops RESPIRATORY:  Clear to auscultation without rales, wheezing or rhonchi  ABDOMEN: Soft, non-tender, non-distended MUSCULOSKELETAL:  No edema; No deformity  SKIN: Warm and dry LOWER EXTREMITIES: no swelling NEUROLOGIC:  Alert and oriented x 3 PSYCHIATRIC:  Normal affect   ASSESSMENT:    1. Inappropriate sinus tachycardia   2. Diabetes mellitus without complication ([place])   3. Shortness of breath   4. Palpitations   5. Atypical chest pain    PLAN:    In order of problems listed above:  1. Inappropriate sinus tachycardia seems to be doing well from that point review on small dose of beta-blocker which I will continue. 2. Diabetes diet controlled followed by antimedicine team. 3. Shortness of breath probably deconditioning play significant role here.  I encouraged him to be L be more active. 4. Fluttering/palpitations difficult to assess which symptoms she described.  I will not alter any of her medications right now.   5. Atypical chest pain: Denies having any   Medication Adjustments/Labs and Tests Ordered: Current medicines are reviewed at length with the patient today.  Concerns regarding medicines are outlined above.  No orders of the defined types were placed in this encounter.  Medication changes: No orders of the defined types were placed in this encounter.   Signed, Park Liter, MD, [facility] 08/06/2020 1:40 PM    [place]

## 2020-08-06 NOTE — Patient Instructions (Signed)

## 2020-08-15 ENCOUNTER — Other Ambulatory Visit: Payer: Self-pay | Admitting: Cardiology

## 2020-08-15 MED ORDER — METOPROLOL TARTRATE 25 MG PO TABS
25.0000 mg | ORAL_TABLET | Freq: Two times a day (BID) | ORAL | 1 refills | Status: DC
Start: 2020-08-15 — End: 2021-12-18

## 2020-08-15 NOTE — Telephone Encounter (Signed)
*  STAT* If patient is at the pharmacy, call can be transferred to refill team.   1. Which medications need to be refilled? (please list name of each medication and dose if known) metoprolol tartrate (LOPRESSOR) 25 MG tablet  2. Which pharmacy/location (including street and city if local pharmacy) is medication to be sent to? [id number] - [place], [place] - [address]  3. Do they need a 30 day or 90 day supply? 90 day

## 2020-08-15 NOTE — Telephone Encounter (Signed)
Rx denied, patient need to set up a consult with Dr. Agustin Cree for any future refills

## 2020-10-17 ENCOUNTER — Other Ambulatory Visit: Payer: Self-pay | Admitting: Hematology and Oncology

## 2020-10-17 DIAGNOSIS — C562 Malignant neoplasm of left ovary: Secondary | ICD-10-CM

## 2020-10-23 ENCOUNTER — Other Ambulatory Visit: Payer: Self-pay

## 2020-11-01 ENCOUNTER — Encounter: Payer: Self-pay | Admitting: Oncology

## 2020-11-01 NOTE — Progress Notes (Unsigned)
Per [facility] ([facility]):    BH_NO_AUTH :  No Prior Authorization is required for this service request CPT [phone] AMH/INHIBIN B labs.

## 2020-11-15 ENCOUNTER — Inpatient Hospital Stay: Payer: Self-pay | Attending: Oncology

## 2020-11-15 ENCOUNTER — Other Ambulatory Visit: Payer: Self-pay

## 2020-11-15 ENCOUNTER — Encounter: Payer: Self-pay | Admitting: Oncology

## 2020-11-15 ENCOUNTER — Other Ambulatory Visit: Payer: Self-pay | Admitting: Hematology and Oncology

## 2020-11-15 VITALS — BP 157/84 | HR 72 | Temp 98.2°F | Resp 18

## 2020-11-15 DIAGNOSIS — Z90721 Acquired absence of ovaries, unilateral: Secondary | ICD-10-CM | POA: Insufficient documentation

## 2020-11-15 DIAGNOSIS — D3912 Neoplasm of uncertain behavior of left ovary: Secondary | ICD-10-CM

## 2020-11-15 DIAGNOSIS — Z9221 Personal history of antineoplastic chemotherapy: Secondary | ICD-10-CM | POA: Insufficient documentation

## 2020-11-15 DIAGNOSIS — Z452 Encounter for adjustment and management of vascular access device: Secondary | ICD-10-CM | POA: Insufficient documentation

## 2020-11-15 DIAGNOSIS — C562 Malignant neoplasm of left ovary: Secondary | ICD-10-CM

## 2020-11-15 DIAGNOSIS — Z79899 Other long term (current) drug therapy: Secondary | ICD-10-CM | POA: Insufficient documentation

## 2020-11-15 DIAGNOSIS — C569 Malignant neoplasm of unspecified ovary: Secondary | ICD-10-CM | POA: Insufficient documentation

## 2020-11-15 LAB — CBC AND DIFFERENTIAL
HCT: 41 (ref 36–46)
Hemoglobin: 13.5 (ref 12.0–16.0)
Neutrophils Absolute: 5.32
Platelets: 252 (ref 150–399)
WBC: 9.5

## 2020-11-15 LAB — COMPREHENSIVE METABOLIC PANEL
Albumin: 4.2 (ref 3.5–5.0)
Calcium: 8.9 (ref 8.7–10.7)

## 2020-11-15 LAB — HEPATIC FUNCTION PANEL
ALT: 44 — AB (ref 7–35)
AST: 37 — AB (ref 13–35)
Alkaline Phosphatase: 114 (ref 25–125)
Bilirubin, Total: 0.5

## 2020-11-15 LAB — BASIC METABOLIC PANEL
BUN: 14 (ref 4–21)
CO2: 29 — AB (ref 13–22)
Chloride: 103 (ref 99–108)
Creatinine: 0.9 (ref 0.5–1.1)
Glucose: 128
Potassium: 3.8 (ref 3.4–5.3)
Sodium: 140 (ref 137–147)

## 2020-11-15 LAB — CBC
MCV: 88 (ref 81–99)
RBC: 4.63 (ref 3.87–5.11)

## 2020-11-15 MED ORDER — HEPARIN SOD (PORK) LOCK FLUSH 100 UNIT/ML IV SOLN
500.0000 [IU] | Freq: Once | INTRAVENOUS | Status: AC | PRN
  Administered 2020-11-15: 500 [IU]
  Filled 2020-11-15: qty 5

## 2020-11-15 MED ORDER — SODIUM CHLORIDE 0.9% FLUSH
10.0000 mL | INTRAVENOUS | Status: DC | PRN
  Administered 2020-11-15: 10 mL
  Filled 2020-11-15: qty 10

## 2020-11-15 NOTE — Patient Instructions (Signed)
Implanted Port Insertion, Care After This sheet gives you information about how to care for yourself after your procedure. Your health care provider may also give you more specific instructions. If you have problems or questions, contact your health care provider. What can I expect after the procedure? After the procedure, it is common to have:  Discomfort at the port insertion site.  Bruising on the skin over the port. This should improve over 3-4 days. Follow these instructions at home: Port care  After your port is placed, you will get a manufacturer's information card. The card has information about your port. Keep this card with you at all times.  Take care of the port as told by your health care provider. Ask your health care provider if you or a family member can get training for taking care of the port at home. A home health care nurse may also take care of the port.  Make sure to remember what type of port you have. Incision care  Follow instructions from your health care provider about how to take care of your port insertion site. Make sure you: ? Wash your hands with soap and water before and after you change your bandage (dressing). If soap and water are not available, use hand sanitizer. ? Change your dressing as told by your health care provider. ? Leave stitches (sutures), skin glue, or adhesive strips in place. These skin closures may need to stay in place for 2 weeks or longer. If adhesive strip edges start to loosen and curl up, you may trim the loose edges. Do not remove adhesive strips completely unless your health care provider tells you to do that.  Check your port insertion site every day for signs of infection. Check for: ? Redness, swelling, or pain. ? Fluid or blood. ? Warmth. ? Pus or a bad smell.      Activity  Return to your normal activities as told by your health care provider. Ask your health care provider what activities are safe for you.  Do not  lift anything that is heavier than 10 lb (4.5 kg), or the limit that you are told, until your health care provider says that it is safe. General instructions  Take over-the-counter and prescription medicines only as told by your health care provider.  Do not take baths, swim, or use a hot tub until your health care provider approves. Ask your health care provider if you may take showers. You may only be allowed to take sponge baths.  Do not drive for 24 hours if you were given a sedative during your procedure.  Wear a medical alert bracelet in case of an emergency. This will tell any health care providers that you have a port.  Keep all follow-up visits as told by your health care provider. This is important. Contact a health care provider if:  You cannot flush your port with saline as directed, or you cannot draw blood from the port.  You have a fever or chills.  You have redness, swelling, or pain around your port insertion site.  You have fluid or blood coming from your port insertion site.  Your port insertion site feels warm to the touch.  You have pus or a bad smell coming from the port insertion site. Get help right away if:  You have chest pain or shortness of breath.  You have bleeding from your port that you cannot control. Summary  Take care of the port as told by your   health care provider. Keep the manufacturer's information card with you at all times.  Change your dressing as told by your health care provider.  Contact a health care provider if you have a fever or chills or if you have redness, swelling, or pain around your port insertion site.  Keep all follow-up visits as told by your health care provider. This information is not intended to replace advice given to you by your health care provider. Make sure you discuss any questions you have with your health care provider. Document Revised: 04/13/2018 Document Reviewed: 04/13/2018 Elsevier Patient Education   2021 Elsevier Inc.  

## 2020-11-16 ENCOUNTER — Other Ambulatory Visit: Payer: Self-pay | Admitting: Oncology

## 2020-11-16 NOTE — Telephone Encounter (Addendum)
Pt notified of below. She states I don't know why they sent the request to y'all. She will notify her PCP. [phone]   ----- Message from Derwood Kaplan, MD sent at 11/16/2020 12:39 PM EST ----- Regarding: refill Pls tell her she needs to have her PCP order her metformin (Dr. Laqueta Due?)

## 2020-11-19 ENCOUNTER — Encounter: Payer: Self-pay | Admitting: Oncology

## 2020-11-19 NOTE — Progress Notes (Incomplete)
[facility]  [address] [place],  [place]  [address] [phone]  Clinic Day:  11/19/2020  Referring physician: Greig Right, MD  This document serves as a record of services personally performed by Hosie Poisson, MD. It was created on their behalf by Curry,Lauren E, a trained medical scribe. The creation of this record is based on the scribe's personal observations and the provider's statements to them.  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  She was treated with a left salpingo-oophorectomy, as the lesion appeared cystic.  Pathology revealed a 9 cm granulosa cell tumor with rupture of the capsule.  She received adjuvant chemotherapy with 4 cycles of bleomycin, etoposide and cisplatin.  She did not receive all of the bleomycin doses because of respiratory problems, which included infection and pulmonary emboli.  She was treated with Coumadin for 6 months for the pulmonary emboli.  She had multiple toxicities from chemotherapy and required an admission for febrile neutropenia.  Due to her history of ovarian cancer, she underwent testing for hereditary cancer syndromes with the Myriad myRisk Hereditary Cancer Panel test.  This did not reveal any clinically significant mutation or any variants of uncertain significance.  She has a right ovarian cyst, which is being followed, as the patient decided to delay right salpingo-oophorectomy and hysterectomy.  She was found to have a low B12 level and was initially on monthly injections, then transitioned to oral.  She has also had mild chronic hypokalemia, but does not like to take a potassium supplement.  She has also had mild elevation of the liver transaminases, which have  fluctuated up and down.  She reports gluten sensitivity, but has difficulty following the diet.  CT abdomen and pelvis in January 2019 revealed a stable right ovarian cyst, normal appearing uterus, and no evidence of recurrent/metastatic disease.  No further imaging was recommended.  Tumor markers were also normal.  Annual mammogram in February 2021 did not reveal any evidence of malignancy.    She is here for routine follow up and states that she has been doing fairly well.  She is now on metformin 500 mg twice daily for diabetes.  She reports diarrhea, which she attributes to the metformin.  She denies nausea or vomiting.  She denies abdominal pain.  She denies vaginal discharge or bleeding.  She denies any urinary symptoms.  She denies fevers or chills.  She denies sore throat, cough, dyspnea or chest pain.  She states her appetite is good, but she still has difficulty following a gluten free diet.  She has been trying to lose weight.  She has not had the COVID-19 vaccines.  She states her main concerns are potential cardiac adverse effects.  INTERVAL HISTORY:  Nicole Beck is here for follow up ***.   Her  appetite is good, and she has gained/lost _ pounds since her last visit.  She denies fever, chills or other signs of infection.  She denies nausea, vomiting, bowel issues, or abdominal pain.  She denies sore throat, cough, dyspnea, or chest pain.  REVIEW OF SYSTEMS:  Review of Systems - Oncology   VITALS:  There were no vitals taken for this visit.  Wt Readings from Last 3 Encounters:  08/06/20 209 lb (94.8 kg)  08/23/19 210 lb (95.3 kg)  05/03/18 209 lb (94.8 kg)    There is no height or weight on file to calculate BMI.  Performance status (ECOG): {CHL ONC [id number]  PHYSICAL EXAM:  Physical Exam  LABS:   CBC Latest Ref Rng & Units 11/15/2020  WBC - 9.5  Hemoglobin 12.0 - 16.0 13.5  Hematocrit 36 - 46 41  Platelets 150 - 399 252   CMP Latest Ref Rng & Units 11/15/2020 05/03/2018   Glucose 65 - 99 mg/dL - 117(H)  BUN 4 - _0 Creatinine 0.5 - 1.1 0.9 0.96  Sodium 137 - 147 140 141  Potassium 3.4 - 5.3 3.8 4.1  Chloride 99 - 108 103 104  CO2 13 - 22 29(A) 24  Calcium 8.7 - 10.7 8.9 9.2  Alkaline Phos 25 - 125 114 -  AST 13 - 35 37(A) -  ALT 7 - 35 44(A) -     No results found for: CEA1 / No results found for: CEA1 No results found for: PSA1 No results found for: COB794 No results found for: TNZ182  No results found for: TOTALPROTELP, ALBUMINELP, A1GS, A2GS, BETS, BETA2SER, GAMS, MSPIKE, SPEI No results found for: TIBC, FERRITIN, IRONPCTSAT No results found for: LDH   STUDIES:  No results found.   Allergies:  Allergies  Allergen Reactions  . Other Nausea And Vomiting and Other (See Comments)    Real strong antibiotics  . Codeine Nausea And Vomiting    Current Medications: Current Outpatient Medications  Medication Sig Dispense Refill  . ibuprofen (ADVIL,MOTRIN) 200 MG tablet Take 400 mg by mouth every 6 (six) hours as needed for headache.    . metFORMIN (GLUCOPHAGE) 500 MG tablet Take 500 mg by mouth 2 (two) times daily.    . metoprolol tartrate (LOPRESSOR) 25 MG tablet Take 1 tablet (25 mg total) by mouth 2 (two) times daily. 180 tablet 1   No current facility-administered medications for this visit.     ASSESSMENT & PLAN:   Assessment:   1. 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  She remains without evidence of recurrence.  2. Right ovarian cyst, which has been remained stable on CT imaging for 5 years.  Continued imaging is not recommended.  Certainly, if she develops symptoms we would obtain imaging at that time.  3. Abnormal liver transaminases, which are nearly normal, likely due to fatty liver.    4. History of asthma.  5. History of pulmonary emboli in 2014.  6. Gluten sensitivity.  She is suppose to be on a celiac diet, but has trouble following this.  7. New onset  diabetes, for which she is on metformin.    Plan: We will see her back in 6 months with CBC, comprehensive metabolic profile, inhibin B, AMH, and port flush.  The patient understands the plans discussed today and is in agreement with them.  She knows to contact our office if she develops concerns prior to her next appointment.   I provided *** minutes of face-to-face time during this this encounter and > 50% was spent counseling as documented under my assessment and plan.    Derwood Kaplan, MD [facility] [address] [place] [address] Dept: [phone] Dept Fax: [phone]   I, Rita Ohara, am acting as scribe for Derwood Kaplan, MD  I have reviewed this  report as typed by the medical scribe, and it is complete and accurate.  Hermina Barters

## 2020-11-20 LAB — INHIBIN B: Inhibin B: <7 pg/mL (ref 0.0–16.9)

## 2020-11-22 ENCOUNTER — Inpatient Hospital Stay: Payer: Self-pay | Admitting: Hematology and Oncology

## 2020-11-22 LAB — ANTI MULLERIAN HORMONE: ANTI-MULLERIAN HORMONE (AMH): <0.015 ng/mL

## 2020-11-22 NOTE — Progress Notes (Deleted)
[place]  [address] [place],  [place]  [address] [phone]  Clinic Day:  11/22/2020  Referring physician: Greig Right, MD  This document serves as a record of services personally performed by Hosie Poisson, MD. It was created on their behalf by Curry,Lauren E, a trained medical scribe. The creation of this record is based on the scribe's personal observations and the provider's statements to them.  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  She was treated with a left salpingo-oophorectomy, as the lesion appeared cystic.  Pathology revealed a 9 cm granulosa cell tumor with rupture of the capsule.  She received adjuvant chemotherapy with 4 cycles of bleomycin, etoposide and cisplatin.  She did not receive all of the bleomycin doses because of respiratory problems, which included infection and pulmonary emboli.  She was treated with Coumadin for 6 months for the pulmonary emboli.  She had multiple toxicities from chemotherapy and required an admission for febrile neutropenia.  Due to her history of ovarian cancer, she underwent testing for hereditary cancer syndromes with the Myriad myRisk Hereditary Cancer Panel test.  This did not reveal any clinically significant mutation or any variants of uncertain significance.  She has a right ovarian cyst, which is being followed, as the patient decided to delay right salpingo-oophorectomy and hysterectomy.  She was found to have a low B12 level and was initially on monthly injections, then transitioned to oral.  She has also had mild chronic hypokalemia, but does not like to take a potassium supplement.  She has also had mild elevation of the liver transaminases, which have  fluctuated up and down.  She reports gluten sensitivity, but has difficulty following the diet.  CT abdomen and pelvis in January 2019 revealed a stable right ovarian cyst, normal appearing uterus, and no evidence of recurrent/metastatic disease.  No further imaging was recommended.  Tumor markers were also normal.  Annual mammogram in February 2021 did not reveal any evidence of malignancy.    She is here for routine follow up and states that she has been doing fairly well.  She is now on metformin 500 mg twice daily for diabetes.  She reports diarrhea, which she attributes to the metformin.  She denies nausea or vomiting.  She denies abdominal pain.  She denies vaginal discharge or bleeding.  She denies any urinary symptoms.  She denies fevers or chills.  She denies sore throat, cough, dyspnea or chest pain.  She states her appetite is good, but she still has difficulty following a gluten free diet.  She has been trying to lose weight.  She has not had the COVID-19 vaccines.  She states her main concerns are potential cardiac adverse effects.  INTERVAL HISTORY:  Nicole Beck is here for follow up ***.   Her  appetite is good, and she has gained/lost _ pounds since her last visit.  She denies fever, chills or other signs of infection.  She denies nausea, vomiting, bowel issues, or abdominal pain.  She denies sore throat, cough, dyspnea, or chest pain.  REVIEW OF SYSTEMS:  Review of Systems - Oncology   VITALS:  There were no vitals taken for this visit.  Wt Readings from Last 3 Encounters:  08/06/20 209 lb (94.8 kg)  08/23/19 210 lb (95.3 kg)  05/03/18 209 lb (94.8 kg)    There is no height or weight on file to calculate BMI.  Performance status (ECOG): {CHL ONC Q3448304  PHYSICAL EXAM:  Physical Exam  LABS:   CBC Latest Ref Rng & Units 11/15/2020  WBC - 9.5  Hemoglobin 12.0 - 16.0 13.5  Hematocrit 36 - 46 41  Platelets 150 - 399 252   CMP Latest Ref Rng & Units 11/15/2020 05/03/2018   Glucose 65 - 99 mg/dL - 117(H)  BUN 4 - _0 Creatinine 0.5 - 1.1 0.9 0.96  Sodium 137 - 147 140 141  Potassium 3.4 - 5.3 3.8 4.1  Chloride 99 - 108 103 104  CO2 13 - 22 29(A) 24  Calcium 8.7 - 10.7 8.9 9.2  Alkaline Phos 25 - 125 114 -  AST 13 - 35 37(A) -  ALT 7 - 35 44(A) -     No results found for: CEA1 / No results found for: CEA1 No results found for: PSA1 No results found for: JEH631 No results found for: SHF026  No results found for: TOTALPROTELP, ALBUMINELP, A1GS, A2GS, BETS, BETA2SER, GAMS, MSPIKE, SPEI No results found for: TIBC, FERRITIN, IRONPCTSAT No results found for: LDH   STUDIES:  No results found.   Allergies:  Allergies  Allergen Reactions  . Other Nausea And Vomiting and Other (See Comments)    Real strong antibiotics  . Codeine Nausea And Vomiting    Current Medications: Current Outpatient Medications  Medication Sig Dispense Refill  . ibuprofen (ADVIL,MOTRIN) 200 MG tablet Take 400 mg by mouth every 6 (six) hours as needed for headache.    . metFORMIN (GLUCOPHAGE) 500 MG tablet Take 500 mg by mouth 2 (two) times daily.    . metoprolol tartrate (LOPRESSOR) 25 MG tablet Take 1 tablet (25 mg total) by mouth 2 (two) times daily. 180 tablet 1   No current facility-administered medications for this visit.     ASSESSMENT & PLAN:   Assessment:   1. 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  She remains without evidence of recurrence.  2. Right ovarian cyst, which has been remained stable on CT imaging for 5 years.  Continued imaging is not recommended.  Certainly, if she develops symptoms we would obtain imaging at that time.  3. Abnormal liver transaminases, which are nearly normal, likely due to fatty liver.    4. History of asthma.  5. History of pulmonary emboli in 2014.  6. Gluten sensitivity.  She is suppose to be on a celiac diet, but has trouble following this.  7. New onset  diabetes, for which she is on metformin.    Plan: We will see her back in 6 months with CBC, comprehensive metabolic profile, inhibin B, AMH, and port flush.  The patient understands the plans discussed today and is in agreement with them.  She knows to contact our office if she develops concerns prior to her next appointment.   I provided *** minutes of face-to-face time during this this encounter and > 50% was spent counseling as documented under my assessment and plan.    Melodye Ped, NP [facility] AT [facility] [address] [place] [address] Dept: [phone] Dept Fax: [phone]   I, Rita Ohara, am acting as scribe for Derwood Kaplan, MD  I have reviewed this  report as typed by the medical scribe, and it is complete and accurate.  Melodye Ped, NP

## 2020-11-26 ENCOUNTER — Telehealth: Payer: Self-pay | Admitting: Oncology

## 2020-11-26 ENCOUNTER — Other Ambulatory Visit: Payer: Self-pay

## 2020-11-26 ENCOUNTER — Inpatient Hospital Stay (HOSPITAL_BASED_OUTPATIENT_CLINIC_OR_DEPARTMENT_OTHER): Payer: Self-pay | Admitting: Hematology and Oncology

## 2020-11-26 VITALS — BP 153/75 | HR 79 | Temp 98.2°F | Resp 16 | Ht 68.0 in | Wt 212.8 lb

## 2020-11-26 DIAGNOSIS — D3912 Neoplasm of uncertain behavior of left ovary: Secondary | ICD-10-CM

## 2020-11-26 MED ORDER — NYSTATIN 100000 UNIT/GM EX POWD: 1.0000 "application " | Freq: Three times a day (TID) | CUTANEOUS | 3 refills | Status: DC

## 2020-11-26 NOTE — Telephone Encounter (Signed)
Per 11/26/20 los next appt sched and given to patient

## 2020-11-26 NOTE — Progress Notes (Signed)
[facility]  [address] 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  Current Treatment:  Surveillance   HISTORY OF PRESENT ILLNESS:  Nicole Beck is a 55 y.o. 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  She was treated with a left salpingo-oophorectomy, as the lesion appeared cystic.  Pathology revealed a 9 cm granulosa cell tumor with rupture of the capsule.  She received adjuvant chemotherapy with 4 cycles of bleomycin, etoposide and cisplatin.  She did not receive all of the bleomycin doses because of respiratory problems, which included infection and pulmonary emboli.  She was treated with Coumadin for 6 months for the pulmonary emboli.  She had multiple toxicities from chemotherapy and required an admission for febrile neutropenia.  Due to her history of ovarian cancer, she underwent testing for hereditary cancer syndromes with the Myriad myRisk Hereditary Cancer Panel test.  This did not reveal any clinically significant mutation or any variants of uncertain significance.  She has a right ovarian cyst, which is being followed, as the patient decided to delay right salpingo-oophorectomy and hysterectomy.  She was found to have a low B12 level and was initially on monthly injections, then transitioned to oral.  She has also had mild chronic hypokalemia, but does not like to take a potassium supplement.  She has also had mild elevation of the liver transaminases, which have fluctuated up and down.  She reports gluten sensitivity, but has difficulty following the diet.  CT abdomen and pelvis in January 2019 revealed a stable right ovarian cyst, normal appearing uterus, and no evidence of  recurrent/metastatic disease.  No further imaging was recommended.  Tumor markers were also normal.  Annual mammogram in February 2021 did not reveal any evidence of malignancy.    INTERVAL HISTORY:  [name] is here for 6 month evaluation. She has been well since her last visit. Her only concern today is weight gain. She would like to start a nutrition plan called [employer] which is a nutritional plan in which clients are provided with specific food/ meals. She states she has tried several options on her own, but is interested in this program as it has a level of accountability with coaching that she feels will work for her. She has gained 3 pounds since last visit. She has difficulty sleeping, but does not want to try medication. She is hoping to feel better in general if she loses weight. She denies fever, chills, nausea or vomiting. She denies shortness of breath, chest pain or cough. She denies issue with bowel or bladder. CBC and CMP were unremarkable. REVIEW OF SYSTEMS:  Review of Systems  Constitutional: Negative for appetite change, chills, diaphoresis, fatigue, fever and unexpected weight change.  HENT:   Negative for hearing loss, lump/mass, mouth sores, nosebleeds, sore throat, tinnitus, trouble swallowing and voice change.   Eyes: Negative for eye problems and icterus.  Respiratory: Negative for chest tightness, cough, hemoptysis, shortness of breath and wheezing.   Cardiovascular: Negative for chest pain, leg swelling and palpitations.  Gastrointestinal: Negative for abdominal distention, abdominal pain, blood in stool, constipation, diarrhea, nausea, rectal pain and vomiting.  Endocrine: Negative for hot flashes.  Genitourinary: Negative for bladder incontinence,  difficulty urinating, dyspareunia, dysuria, frequency, hematuria and nocturia.   Musculoskeletal: Negative for arthralgias, back pain, flank pain, gait problem, myalgias, neck pain and neck stiffness.  Skin: Negative for  itching, rash and wound.  Neurological: Negative for dizziness, extremity weakness, gait problem, headaches, light-headedness, numbness, seizures and speech difficulty.  Hematological: Negative for adenopathy. Does not bruise/bleed easily.  Psychiatric/Behavioral: Positive for sleep disturbance. Negative for confusion, decreased concentration, depression and suicidal ideas. The patient is not nervous/anxious.      VITALS:  Blood pressure (!) 153/75, pulse 79, temperature 98.2 F (36.8 C), temperature source Oral, resp. rate 16, height $RemoveBe'5\' 8"'Qloxcqtsu$  (1.727 m), weight 212 lb 12.8 oz (96.5 kg), SpO2 96 %.  Wt Readings from Last 3 Encounters:  11/26/20 212 lb 12.8 oz (96.5 kg)  08/06/20 209 lb (94.8 kg)  08/23/19 210 lb (95.3 kg)    Body mass index is 32.36 kg/m.  Performance status (ECOG): 0 - Asymptomatic  PHYSICAL EXAM:  Physical Exam Constitutional:      General: She is not in acute distress.    Appearance: Normal appearance. She is normal weight. She is not ill-appearing, toxic-appearing or diaphoretic.  HENT:     Head: Normocephalic and atraumatic.     Nose: Nose normal. No congestion or rhinorrhea.     Mouth/Throat:     Mouth: Mucous membranes are moist.     Pharynx: Oropharynx is clear. No oropharyngeal exudate or posterior oropharyngeal erythema.  Eyes:     General: No scleral icterus.       Right eye: No discharge.        Left eye: No discharge.     Extraocular Movements: Extraocular movements intact.     Conjunctiva/sclera: Conjunctivae normal.     Pupils: Pupils are equal, round, and reactive to light.  Neck:     Vascular: No carotid bruit.  Cardiovascular:     Rate and Rhythm: Normal rate and regular rhythm.     Heart sounds: No murmur heard. No friction rub. No gallop.   Pulmonary:     Effort: Pulmonary effort is normal. No respiratory distress.     Breath sounds: Normal breath sounds. No stridor. No wheezing, rhonchi or rales.  Chest:     Chest wall: No tenderness.   Abdominal:     General: Abdomen is flat. Bowel sounds are normal. There is no distension.     Palpations: There is no mass.     Tenderness: There is no abdominal tenderness. There is no right CVA tenderness, left CVA tenderness, guarding or rebound.     Hernia: No hernia is present.  Musculoskeletal:        General: No swelling, tenderness, deformity or signs of injury. Normal range of motion.     Cervical back: Normal range of motion and neck supple. No rigidity or tenderness.     Right lower leg: No edema.     Left lower leg: No edema.  Lymphadenopathy:     Cervical: No cervical adenopathy.  Skin:    General: Skin is warm and dry.     Capillary Refill: Capillary refill takes less than 2 seconds.     Coloration: Skin is not jaundiced or pale.     Findings: No bruising, erythema, lesion or rash.  Neurological:     General: No focal deficit present.     Mental Status: She is alert and oriented to person, place, and time. Mental status is at baseline.     Cranial Nerves: No cranial nerve  deficit.     Sensory: No sensory deficit.     Motor: No weakness.     Coordination: Coordination normal.     Gait: Gait normal.     Deep Tendon Reflexes: Reflexes normal.  Psychiatric:        Mood and Affect: Mood normal.        Behavior: Behavior normal.        Thought Content: Thought content normal.        Judgment: Judgment normal.     LABS:   CBC Latest Ref Rng & Units 11/15/2020  WBC - 9.5  Hemoglobin 12.0 - 16.0 13.5  Hematocrit 36 - 46 41  Platelets 150 - 399 252   CMP Latest Ref Rng & Units 11/15/2020 05/03/2018  Glucose 65 - 99 mg/dL - 117(H)  BUN 4 - 21 14 13   Creatinine 0.5 - 1.1 0.9 0.96  Sodium 137 - 147 140 141  Potassium 3.4 - 5.3 3.8 4.1  Chloride 99 - 108 103 104  CO2 13 - 22 29(A) 24  Calcium 8.7 - 10.7 8.9 9.2  Alkaline Phos 25 - 125 114 -  AST 13 - 35 37(A) -  ALT 7 - 35 44(A) -     No results found for: CEA1 / No results found for: CEA1 No results found for:  PSA1 No results found for: EBV136 No results found for: UZR923  No results found for: TOTALPROTELP, ALBUMINELP, A1GS, A2GS, BETS, BETA2SER, GAMS, MSPIKE, SPEI No results found for: TIBC, FERRITIN, IRONPCTSAT No results found for: LDH   STUDIES:  No results found.   Allergies:  Allergies  Allergen Reactions  . Other Nausea And Vomiting and Other (See Comments)    Real strong antibiotics  . Codeine Nausea And Vomiting    Current Medications: Current Outpatient Medications  Medication Sig Dispense Refill  . nystatin (MYCOSTATIN/NYSTOP) powder Apply 1 application topically 3 (three) times daily. 15 g 3  . ibuprofen (ADVIL,MOTRIN) 200 MG tablet Take 400 mg by mouth every 6 (six) hours as needed for headache.    . metFORMIN (GLUCOPHAGE) 500 MG tablet Take 500 mg by mouth 2 (two) times daily.    . metoprolol tartrate (LOPRESSOR) 25 MG tablet Take 1 tablet (25 mg total) by mouth 2 (two) times daily. 180 tablet 1   No current facility-administered medications for this visit.     ASSESSMENT & PLAN:   Assessment:   1. 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  She remains without evidence of recurrence.  2. Weight gain. We discussed that she should be able to try [employer] as a nutritional plan; however I did encourage her to take a daily multivitamin as well.    Plan: We will see her back in 6 months with CBC, comprehensive metabolic profile, inhibin B, AMH, and port flush.    The patient understands the plans discussed today and is in agreement with them.  She knows to contact our office if she develops concerns prior to her next appointment.      Melodye Ped, NP [facility] AT [facility] [address] [place] [address] Dept: [phone] Dept Fax: [phone]

## 2020-11-27 ENCOUNTER — Other Ambulatory Visit: Payer: Self-pay | Admitting: Oncology

## 2020-11-27 DIAGNOSIS — R928 Other abnormal and inconclusive findings on diagnostic imaging of breast: Secondary | ICD-10-CM

## 2020-11-28 ENCOUNTER — Telehealth: Payer: Self-pay | Admitting: Oncology

## 2020-11-28 NOTE — Telephone Encounter (Signed)
11/28/20 spoke with patient and sched Left brest mammo w/breast u/s -12/19/20@940am 

## 2020-12-05 ENCOUNTER — Telehealth: Payer: Self-pay | Admitting: Oncology

## 2020-12-05 NOTE — Telephone Encounter (Signed)
11/28/20 Left diagnostic mammogram and left breast u/s sched on 12/19/20@940am 

## 2020-12-06 ENCOUNTER — Telehealth: Payer: Self-pay

## 2020-12-06 NOTE — Telephone Encounter (Signed)
Pt notified of Dr Remi Deter response below. She will keep scheduled appt.    RE: Diagnostic mammo Received: Today Derwood Kaplan, MD  Dairl Ponder, RN No, this does not look like it's from the vaccine, that would be more in the lymph nodes, this is in the left breast, so I would rec she go ahead      Pt called to report that she "was told she needed another mammogram done on the left side due to dense tissue. I wanted to ask Dr Hinton Rao if she thought the area they are talkling about having changes could be from the COVID vaccine? My last COVID injection was Oct 2021, and it was given on my left arm. I'll do whatever she wants me too".  Nicole Beck has diagnostic mammo scheduled for 12/19/20.  Please advise.

## 2020-12-12 ENCOUNTER — Telehealth: Payer: Self-pay | Admitting: Cardiology

## 2020-12-12 NOTE — Telephone Encounter (Signed)
Called patient. She reports that she has been anxious lately since finding out her mammogram results were abnormal and she goes next week for a ultrasound to help confirm the cause. Yesterday she felt chest pressure that did not radiate. No shortness of breath associated with this. She said she is just anxious due to recent news. She wanted to let us know. I advised her to monitor this is if gets worse or more intense let us know. She understood. Will send to Dr. Agustin Cree in case there are further recommendations.

## 2020-12-12 NOTE — Telephone Encounter (Signed)
  Pt c/o of Chest Pain: STAT if CP now or developed within 24 hours  1. Are you having CP right now? no  2. Are you experiencing any other symptoms (ex. SOB, nausea, vomiting, sweating)? no  3. How long have you been experiencing CP? Yesterday morning  4. Is your CP continuous or coming and going? Came and went  5. Have you taken Nitroglycerin? no ? Patient states she has had a lot of anxiety and her HR has been high. She states today it ranged 65-117. She states earlier it was 53 something. She states she also had chest tightness yesterday morning. She is not having symptoms now. She states she has not been getting much sleep due to anxiety.

## 2020-12-13 ENCOUNTER — Encounter: Payer: Self-pay | Admitting: Oncology

## 2020-12-14 ENCOUNTER — Telehealth: Payer: Self-pay

## 2020-12-14 NOTE — Telephone Encounter (Signed)
Patient notified

## 2020-12-14 NOTE — Telephone Encounter (Signed)
-----   Message from Derwood Kaplan, MD sent at 12/13/2020  8:00 PM EDT ----- Regarding: call pt I see her repeat mammo is all clear, we will repeat in 1 year

## 2020-12-21 ENCOUNTER — Encounter: Payer: Self-pay | Admitting: Cardiology

## 2021-01-10 DIAGNOSIS — D126 Benign neoplasm of colon, unspecified: Secondary | ICD-10-CM | POA: Insufficient documentation

## 2021-01-10 HISTORY — DX: Benign neoplasm of colon, unspecified: D12.6

## 2021-02-11 ENCOUNTER — Ambulatory Visit: Payer: Self-pay | Admitting: Cardiology

## 2021-05-08 ENCOUNTER — Telehealth: Payer: Self-pay | Admitting: Oncology

## 2021-05-08 NOTE — Telephone Encounter (Signed)
Patient called to verify 8/22, 8/29 Appt's

## 2021-05-17 NOTE — Progress Notes (Signed)
[facility]  [address] [place],  [place]  [address] [phone]  Clinic Day:  05/27/2021  Referring physician: Haydee Salter, NP  This document serves as a record of services personally performed by Hosie Poisson, MD. It was created on their behalf by Curry,Lauren E, a trained medical scribe. The creation of this record is based on the scribe's personal observations and the provider's statements to them.  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  She was treated with a left salpingo-oophorectomy, as the lesion appeared cystic.  Pathology revealed a 9 cm granulosa cell tumor with rupture of the capsule.  She received adjuvant chemotherapy with 4 cycles of bleomycin, etoposide and cisplatin.  She did not receive all of the bleomycin doses because of respiratory problems, which included infection and pulmonary emboli.  She was treated with Coumadin for 6 months for the pulmonary emboli.  She had multiple toxicities from chemotherapy and required an admission for febrile neutropenia.  Due to her history of ovarian cancer, she underwent testing for hereditary cancer syndromes with the Myriad myRisk Hereditary Cancer Panel test.  This did not reveal any clinically significant mutation or any variants of uncertain significance.  She has a right ovarian cyst, which is being followed, as the patient decided to delay right salpingo-oophorectomy and hysterectomy.  She was found to have a low B12 level and was initially on monthly injections, then transitioned to oral.  She has also had mild chronic hypokalemia, but does not like to take a potassium supplement.  She has also had mild elevation of the liver transaminases, which have  fluctuated up and down.  She reports gluten sensitivity, but has difficulty following the diet.  CT abdomen and pelvis in January 2019 revealed a stable right ovarian cyst, normal appearing uterus, and no evidence of recurrent/metastatic disease.  No further imaging was recommended.  Tumor markers were also normal. Screening mammography from February 2022 revealed a possible distortion in the left breast.  Therefore diagnostic unilateral left mammogram and ultrasound were pursued in March which confirmed no evidence of malignancy. The recently suspected left breast distortion was close apposition of normal breast tissue.    INTERVAL HISTORY:  [name] is here for routine follow up and states that she has been well.  She denies complaints.  She states that she discontinued her cholesterol medication 4 months ago, due to worsening arthralgias.  Blood counts and chemistries are unremarkable except for a potassium of 3.4.  She has started including more potassium rich foods in her diet.  AMH was normal <0.0.15, and inhibin B was normal <7.0.  Her  appetite is good, and she has lost 10 pounds since her last visit.  She denies fever, chills or other signs of infection.  She denies nausea, vomiting, bowel issues, or abdominal pain.  She denies sore throat, cough, dyspnea, or chest pain.  REVIEW OF SYSTEMS:  Review of Systems  Constitutional: Negative.  Negative for appetite change, chills, fatigue, fever and unexpected weight change.  HENT:  Negative.    Eyes: Negative.   Respiratory: Negative.  Negative for chest tightness, cough, hemoptysis, shortness of breath and wheezing.   Cardiovascular: Negative.  Negative for chest pain, leg swelling  and palpitations.  Gastrointestinal: Negative.  Negative for abdominal distention, abdominal pain, blood in stool, constipation, diarrhea, nausea and vomiting.  Endocrine: Negative.   Genitourinary: Negative.  Negative for difficulty urinating, dysuria, frequency and  hematuria.   Musculoskeletal: Negative.  Negative for arthralgias, back pain, flank pain, gait problem and myalgias.  Skin: Negative.   Neurological: Negative.  Negative for dizziness, extremity weakness, gait problem, headaches, light-headedness, numbness, seizures and speech difficulty.  Hematological: Negative.   Psychiatric/Behavioral: Negative.  Negative for depression and sleep disturbance. The patient is not nervous/anxious.     VITALS:  Blood pressure (!) 143/72, pulse 85, temperature 97.9 F (36.6 C), temperature source Oral, resp. rate 18, height _0  (1.727 m), weight 202 lb 12.8 oz (92 kg), SpO2 96 %.  Wt Readings from Last 3 Encounters:  05/27/21 202 lb 12.8 oz (92 kg)  05/20/21 199 lb 4 oz (90.4 kg)  11/26/20 212 lb 12.8 oz (96.5 kg)    Body mass index is 30.84 kg/m.  Performance status (ECOG): 0 - Asymptomatic  PHYSICAL EXAM:  Physical Exam Constitutional:      General: She is not in acute distress.    Appearance: Normal appearance. She is normal weight.  HENT:     Head: Normocephalic and atraumatic.  Eyes:     General: No scleral icterus.    Extraocular Movements: Extraocular movements intact.     Conjunctiva/sclera: Conjunctivae normal.     Pupils: Pupils are equal, round, and reactive to light.  Cardiovascular:     Rate and Rhythm: Normal rate and regular rhythm.     Pulses: Normal pulses.     Heart sounds: Normal heart sounds. No murmur heard.   No friction rub. No gallop.  Pulmonary:     Effort: Pulmonary effort is normal. No respiratory distress.     Breath sounds: Normal breath sounds.  Abdominal:     General: Bowel sounds are normal. There is no distension.     Palpations: Abdomen is soft. There is no hepatomegaly, splenomegaly or mass.     Tenderness: There is no abdominal tenderness.  Musculoskeletal:        General: Normal range of motion.     Cervical back: Normal range of motion and neck supple.     Right lower leg: No edema.     Left lower  leg: No edema.  Lymphadenopathy:     Cervical: No cervical adenopathy.  Skin:    General: Skin is warm and dry.  Neurological:     General: No focal deficit present.     Mental Status: She is alert and oriented to person, place, and time. Mental status is at baseline.  Psychiatric:        Mood and Affect: Mood normal.        Behavior: Behavior normal.        Thought Content: Thought content normal.        Judgment: Judgment normal.    LABS:   CBC Latest Ref Rng & Units 05/20/2021 11/15/2020  WBC - 8.2 9.5  Hemoglobin 12.0 - 16.0 13.2 13.5  Hematocrit 36 - 46 40 41  Platelets 150 - 399 244 252   CMP Latest Ref Rng & Units 05/20/2021 11/15/2020 05/03/2018  Glucose 65 - 99 mg/dL - - 117(H)  BUN 4 - _1 Creatinine 0.5 - 1.1 0.8 0.9 0.96  Sodium 137 - 147 141 140 141  Potassium 3.4 - 5.3 3.4 3.8 4.1  Chloride 99 -  108 104 103 104  CO2 13 - 22 25(A) 29(A) 24  Calcium 8.7 - 10.7 9.1 8.9 9.2  Alkaline Phos 25 - 125 100 114 -  AST 13 - 35 34 37(A) -  ALT 7 - 35 28 44(A) -    STUDIES:  No results found.   Allergies:  Allergies  Allergen Reactions   Other Nausea And Vomiting and Other (See Comments)    Real strong antibiotics   Codeine Nausea And Vomiting    Current Medications: Current Outpatient Medications  Medication Sig Dispense Refill   ibuprofen (ADVIL,MOTRIN) 200 MG tablet Take 400 mg by mouth every 6 (six) hours as needed for headache.     metFORMIN (GLUCOPHAGE) 500 MG tablet Take 500 mg by mouth 2 (two) times daily.     metoprolol tartrate (LOPRESSOR) 25 MG tablet Take 1 tablet (25 mg total) by mouth 2 (two) times daily. 180 tablet 1   No current facility-administered medications for this visit.     ASSESSMENT & PLAN:   Assessment:   1. 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  She remains without evidence of recurrence.  2. Hypokalemia, mild.  She has already started to include potassium  rich foods in her diet.  Plan: Her port was flushed last week with her lab work.  I advised her to follow up with her primary care physician regarding a repeat fasting lipid panel.  We did not include that with our labs since she was not fasting.  We will see her back in 6 months with CBC, comprehensive metabolic profile, inhibin B, AMH, and port flush.  The patient understands the plans discussed today and is in agreement with them.  She knows to contact our office if she develops concerns prior to her next appointment.   I provided 15 minutes of face-to-face time during this this encounter and > 50% was spent counseling as documented under my assessment and plan.    Derwood Kaplan, MD [facility] AT [facility] [address] [place] [address] Dept: [phone] Dept Fax: [phone]    I, Rita Ohara, am acting as scribe for Derwood Kaplan, MD  I have reviewed this report as typed by the medical scribe, and it is complete and accurate.

## 2021-05-20 ENCOUNTER — Encounter: Payer: Self-pay | Admitting: Hematology and Oncology

## 2021-05-20 ENCOUNTER — Other Ambulatory Visit: Payer: Self-pay | Admitting: Oncology

## 2021-05-20 ENCOUNTER — Inpatient Hospital Stay: Payer: Self-pay | Attending: Oncology

## 2021-05-20 DIAGNOSIS — Z7984 Long term (current) use of oral hypoglycemic drugs: Secondary | ICD-10-CM | POA: Insufficient documentation

## 2021-05-20 DIAGNOSIS — Z9221 Personal history of antineoplastic chemotherapy: Secondary | ICD-10-CM | POA: Insufficient documentation

## 2021-05-20 DIAGNOSIS — Z8543 Personal history of malignant neoplasm of ovary: Secondary | ICD-10-CM | POA: Insufficient documentation

## 2021-05-20 DIAGNOSIS — C562 Malignant neoplasm of left ovary: Secondary | ICD-10-CM

## 2021-05-20 DIAGNOSIS — E876 Hypokalemia: Secondary | ICD-10-CM | POA: Insufficient documentation

## 2021-05-20 DIAGNOSIS — Z79899 Other long term (current) drug therapy: Secondary | ICD-10-CM | POA: Insufficient documentation

## 2021-05-20 DIAGNOSIS — E785 Hyperlipidemia, unspecified: Secondary | ICD-10-CM

## 2021-05-20 LAB — BASIC METABOLIC PANEL
BUN: 17 (ref 4–21)
CO2: 25 — AB (ref 13–22)
Chloride: 104 (ref 99–108)
Creatinine: 0.8 (ref 0.5–1.1)
Glucose: 179
Potassium: 3.4 (ref 3.4–5.3)
Sodium: 141 (ref 137–147)

## 2021-05-20 LAB — LIPID PANEL
Cholesterol: 247 mg/dL — ABNORMAL HIGH (ref 0–200)
HDL: 49 mg/dL (ref >40)
LDL Cholesterol: 158 mg/dL — ABNORMAL HIGH (ref 0–99)
Total CHOL/HDL Ratio: 5 RATIO
Triglycerides: 200 mg/dL — ABNORMAL HIGH (ref <150)
VLDL: 40 mg/dL (ref 0–40)

## 2021-05-20 LAB — CBC AND DIFFERENTIAL
HCT: 40 (ref 36–46)
Hemoglobin: 13.2 (ref 12.0–16.0)
Neutrophils Absolute: 4.59
Platelets: 244 (ref 150–399)
WBC: 8.2

## 2021-05-20 LAB — CBC: RBC: 4.51 (ref 3.87–5.11)

## 2021-05-20 LAB — HEPATIC FUNCTION PANEL
ALT: 28 (ref 7–35)
AST: 34 (ref 13–35)
Alkaline Phosphatase: 100 (ref 25–125)
Bilirubin, Total: 0.6

## 2021-05-20 LAB — COMPREHENSIVE METABOLIC PANEL
Albumin: 4.2 (ref 3.5–5.0)
Calcium: 9.1 (ref 8.7–10.7)

## 2021-05-22 LAB — INHIBIN B: Inhibin B: <7 pg/mL (ref 0.0–16.9)

## 2021-05-26 LAB — ANTI MULLERIAN HORMONE: ANTI-MULLERIAN HORMONE (AMH): <0.015 ng/mL

## 2021-05-27 ENCOUNTER — Telehealth: Payer: Self-pay | Admitting: Oncology

## 2021-05-27 ENCOUNTER — Other Ambulatory Visit: Payer: Self-pay | Admitting: Oncology

## 2021-05-27 ENCOUNTER — Encounter: Payer: Self-pay | Admitting: Oncology

## 2021-05-27 ENCOUNTER — Inpatient Hospital Stay (HOSPITAL_BASED_OUTPATIENT_CLINIC_OR_DEPARTMENT_OTHER): Payer: Self-pay | Admitting: Oncology

## 2021-05-27 VITALS — BP 143/72 | HR 85 | Temp 97.9°F | Resp 18 | Ht 68.0 in | Wt 202.8 lb

## 2021-05-27 DIAGNOSIS — C562 Malignant neoplasm of left ovary: Secondary | ICD-10-CM

## 2021-05-27 DIAGNOSIS — Z1239 Encounter for other screening for malignant neoplasm of breast: Secondary | ICD-10-CM

## 2021-05-27 NOTE — Telephone Encounter (Signed)
Per 8/29 los next appt scheduled and confirmed by patient

## 2021-05-30 ENCOUNTER — Encounter: Payer: Self-pay | Admitting: Hematology and Oncology

## 2021-06-03 ENCOUNTER — Encounter: Payer: Self-pay | Admitting: Hematology and Oncology

## 2021-06-18 ENCOUNTER — Other Ambulatory Visit: Payer: Self-pay

## 2021-06-18 ENCOUNTER — Ambulatory Visit (INDEPENDENT_AMBULATORY_CARE_PROVIDER_SITE_OTHER): Payer: Self-pay | Admitting: Cardiology

## 2021-06-18 ENCOUNTER — Encounter: Payer: Self-pay | Admitting: Cardiology

## 2021-06-18 VITALS — BP 124/72 | HR 74 | Ht 68.0 in | Wt 206.4 lb

## 2021-06-18 DIAGNOSIS — R0789 Other chest pain: Secondary | ICD-10-CM

## 2021-06-18 DIAGNOSIS — R002 Palpitations: Secondary | ICD-10-CM

## 2021-06-18 DIAGNOSIS — E119 Type 2 diabetes mellitus without complications: Secondary | ICD-10-CM

## 2021-06-18 DIAGNOSIS — R0602 Shortness of breath: Secondary | ICD-10-CM

## 2021-06-18 MED ORDER — ROSUVASTATIN CALCIUM 5 MG PO TABS: 5.0000 mg | ORAL_TABLET | Freq: Every day | ORAL | 3 refills | Status: DC

## 2021-06-18 MED ORDER — ASPIRIN EC 81 MG PO TBEC: 81.0000 mg | DELAYED_RELEASE_TABLET | Freq: Every day | ORAL | 3 refills | Status: DC

## 2021-06-18 NOTE — Addendum Note (Signed)
Addended by: Resa Miner I on: 06/18/2021 02:40 PM   Modules accepted: Orders

## 2021-06-18 NOTE — Progress Notes (Signed)
Cardiology Office Note:    Date:  06/18/2021   ID:  Nicole Beck, DOB [birth year], MRN [id number]  PCP:  Haydee Salter, NP  Cardiologist:  Jenne Campus, MD    Referring MD: Haydee Salter, NP   Chief Complaint  Patient presents with   Medication Refill  I have a chest pain  History of Present Illness:    Nicole Beck is a 55 y.o. female with past medical history significant for palpitations, dyspnea on exertion, atypical chest pain, inappropriate sinus tachycardia, diabetes.  She comes today to my office for follow-up she says she is not doing well lately she is being under a lot of stress.  This is mostly financial stress.  She does have any insurance.  She complained of having some chest pain that she got horrible time trying to describe it what exactly she feels it looks like what ever sensation she feels in her chest happens specially when she gets very upset does not better when he walks or climbs stairs.  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   metoprolol tartrate (LOPRESSOR) 25 MG tablet Take 1 tablet (25 mg total) by mouth 2 (two) times daily.     Allergies:   Other and Codeine   Social History   Socioeconomic History   Marital status:  Married    Spouse name: Not on file   Number of children: Not on file   Years of education: Not on file   Highest education level: Not on file  Occupational History   Not on file  Tobacco Use   Smoking status: Never   Smokeless tobacco: Never  Substance and Sexual Activity   Alcohol use: Never   Drug use: Never   Sexual activity: Yes  Other Topics Concern   Not on file  Social History Narrative   ** Merged History Encounter **       Social Determinants of Health   Financial Resource Strain: Not on file  Food Insecurity: Not on file  Transportation Needs: Not on file  Physical Activity: Not on file  Stress: Not on file  Social Connections: Not on file     Family History: The patient's family history includes CAD in her father, maternal grandfather, and sister; Cancer in her maternal aunt, mother, and sister; Diabetes in her maternal grandmother, mother, and sister. ROS:   Please see the history of present illness.    All 14 point review of systems negative except as described per history of present illness  EKGs/Labs/Other Studies Reviewed:      Recent Labs: 05/20/2021: ALT 28; BUN 17; Creatinine 0.8; Hemoglobin 13.2; Platelets 244; Potassium 3.4; Sodium 141  Recent Lipid Panel  Component Value Date/Time   CHOL 247 (H) 05/20/2021 1050   TRIG 200 (H) 05/20/2021 1050   HDL 49 05/20/2021 1050   CHOLHDL 5.0 05/20/2021 1050   VLDL 40 05/20/2021 1050   LDLCALC 158 (H) 05/20/2021 1050    Physical Exam:    VS:  BP 124/72 (BP Location: Left Arm, Patient Position: Sitting)   Pulse 74   Ht 5\' 8"  (1.727 m)   Wt 206 lb 6.4 oz (93.6 kg)   SpO2 96%   BMI 31.38 kg/m     Wt Readings from Last 3 Encounters:  06/18/21 206 lb 6.4 oz (93.6 kg)  05/27/21 202 lb 12.8 oz (92 kg)  05/20/21 199 lb 4 oz (90.4 kg)     GEN:  Well nourished, well developed in no acute distress HEENT: Normal NECK: No JVD; No carotid bruits LYMPHATICS: No lymphadenopathy CARDIAC: RRR, no  murmurs, no rubs, no gallops RESPIRATORY:  Clear to auscultation without rales, wheezing or rhonchi  ABDOMEN: Soft, non-tender, non-distended MUSCULOSKELETAL:  No edema; No deformity  SKIN: Warm and dry LOWER EXTREMITIES: no swelling NEUROLOGIC:  Alert and oriented x 3 PSYCHIATRIC:  Normal affect   ASSESSMENT:    1. Atypical chest pain   2. Palpitations   3. Shortness of breath   4. Diabetes mellitus without complication ([place])    PLAN:    In order of problems listed above:  Atypical chest pain: We did talk about all different options for the situation.  Sadly she does not be sure she does not want to do a stress test which is very pricey however, she agreed to have coronary calcium score which we will do. Palpitations seems to be under control continue present management. Shortness of breath stable. Diabetes I did review K PN which only had a hemoglobin A1c of 6.0 it looks like she is well managed. Dyslipidemia K PN show me LDL of 150 HDL 49 this is from 05/20/2021 which is unacceptably high cholesterol.  Previously I tried pravastatin, she could not tolerate this.  I will try to give her second statin at this time will be Crestor only 5 mg daily I hope she can tolerate it.   Medication Adjustments/Labs and Tests Ordered: Current medicines are reviewed at length with the patient today.  Concerns regarding medicines are outlined above.  No orders of the defined types were placed in this encounter.  Medication changes: No orders of the defined types were placed in this encounter.   Signed, Park Liter, MD, [facility] 06/18/2021 2:32 PM    [facility]

## 2021-06-18 NOTE — Patient Instructions (Signed)
Medication Instructions:  Your physician has recommended you make the following change in your medication:  START: Aspirin 81 mg take one tablet by mouth daily.  START: Crestor 5 mg take one tablet by mouth daily.  *If you need a refill on your cardiac medications before your next appointment, please call your pharmacy*   Lab Work: Your physician recommends that you return for lab work in: 6 weeks Lipids If you have labs (blood work) drawn today and your tests are completely normal, you will receive your results only by: [place] (if you have MyChart) OR A paper copy in the mail If you have any lab test that is abnormal or we need to change your treatment, we will call you to review the results.   Testing/Procedures: We have placed the order for you to have a CT calcium score completed. They will call you to schedule this appointment.    Follow-Up: At [facility], you and your health needs are our priority.  As part of our continuing mission to provide you with exceptional heart care, we have created designated Provider Care Teams.  These Care Teams include your primary Cardiologist (physician) and Advanced Practice Providers (APPs -  Physician Assistants and Nurse Practitioners) who all work together to provide you with the care you need, when you need it.  We recommend signing up for the patient portal called "MyChart".  Sign up information is provided on this After Visit Summary.  MyChart is used to connect with patients for Virtual Visits (Telemedicine).  Patients are able to view lab/test results, encounter notes, upcoming appointments, etc.  Non-urgent messages can be sent to your provider as well.   To learn more about what you can do with MyChart, go to [email or web address].    Your next appointment:   6 month(s)  The format for your next appointment:   In Person  Provider:   Jenne Campus, MD   Other Instructions

## 2021-07-11 NOTE — Addendum Note (Signed)
Addended by: Resa Miner I on: 07/11/2021 04:34 PM   Modules accepted: Orders

## 2021-07-16 ENCOUNTER — Ambulatory Visit (INDEPENDENT_AMBULATORY_CARE_PROVIDER_SITE_OTHER)
Admission: RE | Admit: 2021-07-16 | Discharge: 2021-07-16 | Disposition: A | Discharge status: Home / Self Care | Payer: Self-pay | Source: Ambulatory Visit | Attending: Cardiology | Admitting: Cardiology

## 2021-07-16 ENCOUNTER — Other Ambulatory Visit: Payer: Self-pay

## 2021-07-16 DIAGNOSIS — R0789 Other chest pain: Secondary | ICD-10-CM

## 2021-07-17 ENCOUNTER — Other Ambulatory Visit: Payer: Self-pay

## 2021-07-17 DIAGNOSIS — R0789 Other chest pain: Secondary | ICD-10-CM

## 2021-07-18 LAB — LIPID PANEL
Chol/HDL Ratio: 3.3 ratio (ref 0.0–4.4)
Cholesterol, Total: 166 mg/dL (ref 100–199)
HDL: 51 mg/dL (ref >39)
LDL Chol Calc (NIH): 85 mg/dL (ref 0–99)
Triglycerides: 179 mg/dL — ABNORMAL HIGH (ref 0–149)
VLDL Cholesterol Cal: 30 mg/dL (ref 5–40)

## 2021-08-31 DIAGNOSIS — E119 Type 2 diabetes mellitus without complications: Secondary | ICD-10-CM | POA: Insufficient documentation

## 2021-10-04 ENCOUNTER — Telehealth: Payer: Self-pay | Admitting: Cardiology

## 2021-10-04 NOTE — Telephone Encounter (Signed)
Spoke to patient. She reports that she has had left ankle swelling for the past 2-3 days. The sight is swollen, red and warm to the touch. She reports it is a little painful at times but not bad. Her husband had the same thing she said a couple weeks ago but his is gout. No swelling in right leg. No shortness of breath. No weight gain. Will check with Dr. Agustin Cree on how to proceed.

## 2021-10-04 NOTE — Telephone Encounter (Signed)
Pt c/o swelling: STAT is pt has developed SOB within 24 hours  How much weight have you gained and in what time span?  No weight gain   If swelling, where is the swelling located?  Patient states her left leg is swollen and red. She states her left ankle is swollen the size of a baseball and very sore. She states this occurred over night.  Are you currently taking a fluid pill?  No   Are you currently SOB?  No   Do you have a log of your daily weights (if so, list)?  No log available  Have you gained 3 pounds in a day or 5 pounds in a week?  No   Have you traveled recently?  No

## 2021-10-04 NOTE — Telephone Encounter (Signed)
Spoke to patient informed her that Dr. Agustin Cree recommends she go to the emergency department.

## 2021-11-19 NOTE — Progress Notes (Signed)
[facility]  [address] [place],  [place]  [address] [phone]  Clinic Day:  11/26/2021  Referring physician: Haydee Salter, NP  This document serves as a record of services personally performed by Hosie Poisson, MD. It was created on their behalf by Curry,Lauren E, a trained medical scribe. The creation of this record is based on the scribe's personal observations and the provider's statements to them.  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  She was treated with a left salpingo-oophorectomy, as the lesion appeared cystic.  Pathology revealed a 9 cm granulosa cell tumor with rupture of the capsule.  She received adjuvant chemotherapy with 4 cycles of bleomycin, etoposide and cisplatin.  She did not receive all of the bleomycin doses because of respiratory problems, which included infection and pulmonary emboli.  She was treated with Coumadin for 6 months for the pulmonary emboli.  She had multiple toxicities from chemotherapy and required an admission for febrile neutropenia.  Due to her history of ovarian cancer, she underwent testing for hereditary cancer syndromes with the Myriad myRisk Hereditary Cancer Panel test.  This did not reveal any clinically significant mutation or any variants of uncertain significance.  She has a right ovarian cyst, which is being followed, as the patient decided to delay right salpingo-oophorectomy and hysterectomy.  She was found to have a low B12 level and was initially on monthly injections, then transitioned to oral.  She has also had mild chronic hypokalemia, but does not like to take a potassium supplement.  She has also had mild elevation of the liver transaminases, which have  fluctuated up and down.  She reports gluten sensitivity, but has difficulty following the diet.  CT abdomen and pelvis in January 2019 revealed a stable right ovarian cyst, normal appearing uterus, and no evidence of recurrent/metastatic disease.  No further imaging was recommended.  Tumor markers were also normal. Screening mammography from February 2022 revealed a possible distortion in the left breast.  Therefore diagnostic unilateral left mammogram and ultrasound were pursued in March which confirmed no evidence of malignancy. The recently suspected left breast distortion was close apposition of normal breast tissue.    INTERVAL HISTORY:  Nicole Beck is here for routine follow up and states that she still has swelling of the left axilla/upper extremity. Otherwise, she has been well and denies other complaints. Blood counts and chemistries are unremarkable. Inhibin B was normal <7.0, and AMH is still pending. Her  appetite is good, and she has gained 6 pounds since her last visit.  She denies fever, chills or other signs of infection.  She denies nausea, vomiting, bowel issues, or abdominal pain.  She denies sore throat, cough, dyspnea, or chest pain.  REVIEW OF SYSTEMS:  Review of Systems  Constitutional: Negative.  Negative for appetite change, chills, fatigue, fever and unexpected weight change.  HENT:  Negative.    Eyes: Negative.   Respiratory: Negative.  Negative for chest tightness, cough, hemoptysis, shortness of breath and wheezing.   Cardiovascular: Negative.  Negative for chest pain, leg swelling and palpitations.  Gastrointestinal: Negative.  Negative for abdominal distention, abdominal pain, blood in stool, constipation, diarrhea, nausea and vomiting.  Endocrine: Negative.   Genitourinary: Negative.  Negative for difficulty urinating, dysuria, frequency and hematuria.   Musculoskeletal: Negative.  Negative for arthralgias, back pain, flank pain, gait problem and myalgias.       Mild  swelling of the left axilla/upper extremity  Skin: Negative.   Neurological: Negative.  Negative for dizziness, extremity weakness, gait problem, headaches, light-headedness, numbness, seizures and speech difficulty.  Hematological: Negative.   Psychiatric/Behavioral: Negative.  Negative for depression and sleep disturbance. The patient is not nervous/anxious.     VITALS:  Blood pressure 125/87, pulse 80, temperature 97.6 F (36.4 C), temperature source Oral, resp. rate 18, height 5' 8"  (1.727 m), weight 208 lb 14.4 oz (94.8 kg), SpO2 96 %.  Wt Readings from Last 3 Encounters:  11/26/21 208 lb 14.4 oz (94.8 kg)  11/20/21 205 lb (93 kg)  06/18/21 206 lb 6.4 oz (93.6 kg)    Body mass index is 31.76 kg/m.  Performance status (ECOG): 0 - Asymptomatic  PHYSICAL EXAM:  Physical Exam Constitutional:      General: She is not in acute distress.    Appearance: Normal appearance. She is normal weight.  HENT:     Head: Normocephalic and atraumatic.  Eyes:     General: No scleral icterus.    Extraocular Movements: Extraocular movements intact.     Conjunctiva/sclera: Conjunctivae normal.     Pupils: Pupils are equal, round, and reactive to light.  Cardiovascular:     Rate and Rhythm: Normal rate and regular rhythm.     Pulses: Normal pulses.     Heart sounds: Normal heart sounds. No murmur heard.   No friction rub. No gallop.  Pulmonary:     Effort: Pulmonary effort is normal. No respiratory distress.     Breath sounds: Normal breath sounds.  Chest:     Comments: A few cysts in the lower outer quadrant of the left breast, which are non-tender and less than 1 cm. Right breast has fibrocystic changes of the lower portion. Both breasts are without masses. Abdominal:     General: Bowel sounds are normal. There is no distension.     Palpations: Abdomen is soft. There is no hepatomegaly, splenomegaly or mass.     Tenderness: There is no abdominal tenderness.  Musculoskeletal:         General: Normal range of motion.     Cervical back: Normal range of motion and neck supple.     Right lower leg: No edema.     Left lower leg: No edema.     Comments: I measured her upper extremity 5 inches below the [facility] joint and the right measures 37 cm, and left measures 34.5 cm.  Lymphadenopathy:     Cervical: No cervical adenopathy.  Skin:    General: Skin is warm and dry.  Neurological:     General: No focal deficit present.     Mental Status: She is alert and oriented to person, place, and time. Mental status is at baseline.  Psychiatric:        Mood and Affect: Mood normal.        Behavior: Behavior normal.        Thought Content: Thought content normal.        Judgment: Judgment normal.    LABS:   CBC Latest Ref Rng & Units 11/20/2021 05/20/2021 11/15/2020  WBC - 9.4 8.2 9.5  Hemoglobin 12.0 - 16.0 13.2 13.2 13.5  Hematocrit 36 - 46 40 40 41  Platelets 150 - 399 273 244 252  CMP Latest Ref Rng & Units 11/20/2021 05/20/2021 11/15/2020  Glucose 65 - 99 mg/dL - - -  BUN 4 - 21 13 17 14   Creatinine 0.5 - 1.1 0.8 0.8 0.9  Sodium 137 - 147 141 141 140  Potassium 3.4 - 5.3 3.7 3.4 3.8  Chloride 99 - 108 107 104 103  CO2 13 - 22 27(A) 25(A) 29(A)  Calcium 8.7 - 10.7 8.9 9.1 8.9  Alkaline Phos 25 - 125 97 100 114  AST 13 - 35 30 34 37(A)  ALT 7 - 35 33 28 44(A)    STUDIES:  No results found.   Allergies:  Allergies  Allergen Reactions   Other Nausea And Vomiting and Other (See Comments)    Real strong antibiotics   Codeine Nausea And Vomiting and Itching    Current Medications: Current Outpatient Medications  Medication Sig Dispense Refill   aspirin EC 81 MG tablet Take 1 tablet (81 mg total) by mouth daily. Swallow whole. 90 tablet 3   metFORMIN (GLUCOPHAGE) 500 MG tablet Take 500 mg by mouth 2 (two) times daily.     metoprolol tartrate (LOPRESSOR) 25 MG tablet Take 1 tablet (25 mg total) by mouth 2 (two) times daily. 180 tablet 1   OVER THE COUNTER MEDICATION  Curamed supplement 1 capsule daily     No current facility-administered medications for this visit.   Facility-Administered Medications Ordered in Other Visits  Medication Dose Route Frequency Provider Last Rate Last Admin   sodium chloride flush (NS) 0.9 % injection 10 mL  10 mL Intracatheter PRN [place] Scrape A, NP   10 mL at 11/20/21 1052     ASSESSMENT & PLAN:   Assessment:   1. 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  She remains without evidence of recurrence.  2.  Mild lymphedema of the left upper extremity and axilla of unknown etiology. She states that this occurred after she received her COVID injection years ago. I gave her a booklet regarding lymphedema, the precautions to take as well as symptoms management. I will plan to refer her to Amy, the lymphedema specialist as well.  Plan: Her port was flushed last week with her lab work. I will schedule her annual bilateral mammogram from mid March, after the 17th, which will be charged to the [employer]. Otherwise, we will see her back in 6 months for repeat evaluation and port flush. We will plan repeat lab work in 1 year. The patient understands the plans discussed today and is in agreement with them.  She knows to contact our office if she develops concerns prior to her next appointment.   I provided 15 minutes of face-to-face time during this this encounter and > 50% was spent counseling as documented under my assessment and plan.    Derwood Kaplan, MD [facility] AT [facility] [address] [place] [address] Dept: [phone] Dept Fax: [phone]    I, Rita Ohara, am acting as scribe for Derwood Kaplan, MD  I have reviewed this report as typed by the medical scribe, and it is complete and accurate.

## 2021-11-20 ENCOUNTER — Encounter: Payer: Self-pay | Admitting: Hematology and Oncology

## 2021-11-20 ENCOUNTER — Inpatient Hospital Stay: Payer: Self-pay | Attending: Oncology

## 2021-11-20 ENCOUNTER — Other Ambulatory Visit: Payer: Self-pay

## 2021-11-20 VITALS — BP 137/85 | HR 74 | Temp 97.4°F | Resp 18 | Ht 68.0 in | Wt 205.0 lb

## 2021-11-20 DIAGNOSIS — C562 Malignant neoplasm of left ovary: Secondary | ICD-10-CM

## 2021-11-20 DIAGNOSIS — E876 Hypokalemia: Secondary | ICD-10-CM | POA: Insufficient documentation

## 2021-11-20 DIAGNOSIS — Z8543 Personal history of malignant neoplasm of ovary: Secondary | ICD-10-CM | POA: Insufficient documentation

## 2021-11-20 LAB — CBC AND DIFFERENTIAL
HCT: 40 (ref 36–46)
Hemoglobin: 13.2 (ref 12.0–16.0)
Neutrophils Absolute: 4.61
Platelets: 273 (ref 150–399)
WBC: 9.4

## 2021-11-20 LAB — CBC: RBC: 4.68 (ref 3.87–5.11)

## 2021-11-20 LAB — HEPATIC FUNCTION PANEL
ALT: 33 (ref 7–35)
AST: 30 (ref 13–35)
Alkaline Phosphatase: 97 (ref 25–125)
Bilirubin, Total: 0.6

## 2021-11-20 LAB — COMPREHENSIVE METABOLIC PANEL
Albumin: 4.1 (ref 3.5–5.0)
Calcium: 8.9 (ref 8.7–10.7)

## 2021-11-20 LAB — BASIC METABOLIC PANEL
BUN: 13 (ref 4–21)
CO2: 27 — AB (ref 13–22)
Chloride: 107 (ref 99–108)
Creatinine: 0.8 (ref 0.5–1.1)
Glucose: 111
Potassium: 3.7 (ref 3.4–5.3)
Sodium: 141 (ref 137–147)

## 2021-11-20 MED ORDER — HEPARIN SOD (PORK) LOCK FLUSH 100 UNIT/ML IV SOLN
500.0000 [IU] | Freq: Once | INTRAVENOUS | Status: AC | PRN
  Administered 2021-11-20: 500 [IU]

## 2021-11-20 MED ORDER — SODIUM CHLORIDE 0.9% FLUSH
10.0000 mL | INTRAVENOUS | Status: AC | PRN
  Administered 2021-11-20: 10 mL

## 2021-11-22 LAB — INHIBIN B: Inhibin B: <7 pg/mL

## 2021-11-26 ENCOUNTER — Telehealth: Payer: Self-pay

## 2021-11-26 ENCOUNTER — Other Ambulatory Visit: Payer: Self-pay

## 2021-11-26 ENCOUNTER — Telehealth: Payer: Self-pay | Admitting: Oncology

## 2021-11-26 ENCOUNTER — Encounter: Payer: Self-pay | Admitting: Oncology

## 2021-11-26 ENCOUNTER — Other Ambulatory Visit: Payer: Self-pay | Admitting: Oncology

## 2021-11-26 ENCOUNTER — Inpatient Hospital Stay (HOSPITAL_BASED_OUTPATIENT_CLINIC_OR_DEPARTMENT_OTHER): Payer: Self-pay | Admitting: Oncology

## 2021-11-26 VITALS — BP 125/87 | HR 80 | Temp 97.6°F | Resp 18 | Ht 68.0 in | Wt 208.9 lb

## 2021-11-26 DIAGNOSIS — Z1239 Encounter for other screening for malignant neoplasm of breast: Secondary | ICD-10-CM

## 2021-11-26 DIAGNOSIS — C562 Malignant neoplasm of left ovary: Secondary | ICD-10-CM

## 2021-11-26 NOTE — Telephone Encounter (Signed)
Per 11/26/21 los next appt scheduled and confirmed with patient

## 2021-11-26 NOTE — Telephone Encounter (Signed)
Order filled out and placed in Dr. Remi Deter box for her to sign.

## 2021-11-26 NOTE — Telephone Encounter (Signed)
-----   Message from Derwood Kaplan, MD sent at 11/26/2021  1:03 PM EST ----- Regarding: refer Pls refer to Amy of OT/lymphedema.  Pt has swelling of left upper arm since COVID vaccine.  She has never had surgery or radiation to arm, I see her for past hx of ovarian cancer.

## 2021-11-28 LAB — ANTI MULLERIAN HORMONE: ANTI-MULLERIAN HORMONE (AMH): <0.015 ng/mL

## 2021-12-02 ENCOUNTER — Encounter: Payer: Self-pay | Admitting: Hematology and Oncology

## 2021-12-09 ENCOUNTER — Encounter: Payer: Self-pay | Admitting: Oncology

## 2021-12-18 ENCOUNTER — Encounter: Payer: Self-pay | Admitting: Cardiology

## 2021-12-18 ENCOUNTER — Ambulatory Visit (INDEPENDENT_AMBULATORY_CARE_PROVIDER_SITE_OTHER): Payer: Self-pay | Admitting: Cardiology

## 2021-12-18 VITALS — BP 118/88 | HR 77 | Ht 68.0 in | Wt 208.2 lb

## 2021-12-18 DIAGNOSIS — R0789 Other chest pain: Secondary | ICD-10-CM

## 2021-12-18 DIAGNOSIS — R0602 Shortness of breath: Secondary | ICD-10-CM

## 2021-12-18 DIAGNOSIS — E119 Type 2 diabetes mellitus without complications: Secondary | ICD-10-CM

## 2021-12-18 DIAGNOSIS — R000 Tachycardia, unspecified: Secondary | ICD-10-CM

## 2021-12-18 MED ORDER — METOPROLOL TARTRATE 25 MG PO TABS: 25.0000 mg | ORAL_TABLET | Freq: Two times a day (BID) | ORAL | 3 refills | Status: DC

## 2021-12-18 MED ORDER — ROSUVASTATIN CALCIUM 5 MG PO TABS: 5.0000 mg | ORAL_TABLET | Freq: Every day | ORAL | 3 refills | Status: DC

## 2021-12-18 NOTE — Progress Notes (Signed)
kg

## 2021-12-18 NOTE — Patient Instructions (Signed)
Medication Instructions:  ?Your physician has recommended you make the following change in your medication:  ?Continue Crestor 5 mg once daily ? ?*If you need a refill on your cardiac medications before your next appointment, please call your pharmacy* ? ? ?Lab Work: ?NONE ?If you have labs (blood work) drawn today and your tests are completely normal, you will receive your results only by: ?MyChart Message (if you have MyChart) OR ?A paper copy in the mail ?If you have any lab test that is abnormal or we need to change your treatment, we will call you to review the results. ? ? ?Testing/Procedures: ?NONE ? ? ?Follow-Up: ?At [facility], you and your health needs are our priority.  As part of our continuing mission to provide you with exceptional heart care, we have created designated Provider Care Teams.  These Care Teams include your primary Cardiologist (physician) and Advanced Practice Providers (APPs -  Physician Assistants and Nurse Practitioners) who all work together to provide you with the care you need, when you need it. ? ?We recommend signing up for the patient portal called "MyChart".  Sign up information is provided on this After Visit Summary.  MyChart is used to connect with patients for Virtual Visits (Telemedicine).  Patients are able to view lab/test results, encounter notes, upcoming appointments, etc.  Non-urgent messages can be sent to your provider as well.   ?To learn more about what you can do with MyChart, go to [email or web address].   ? ?Your next appointment:   ?6 month(s) ? ?The format for your next appointment:   ?In Person ? ?Provider:   ?Jenne Campus, MD  ? ? ?Other Instructions ?  ?

## 2021-12-18 NOTE — Progress Notes (Signed)
?Cardiology Office Note:   ? ?Date:  12/18/2021  ? ?ID:  Nicole Beck, DOB Feb 24, 1966, MRN [id number] ? ?PCP:  Haydee Salter, NP  ?Cardiologist:  Jenne Campus, MD   ? ?Referring MD: Haydee Salter, NP  ? ?Chief Complaint  ?Patient presents with  ? swelling on the right UE  ?  Ongoing since 2020  ? ? ?History of Present Illness:   ? ?Nicole Beck is a 56 y.o. female with past medical history significant for dyslipidemia, diabetes, atypical chest pain.  She described the fact that she was under a lot of stress however things are getting better and she is feeling better.  We end up doing calcium score on her since she could not afford stress test.  Calcium score is 0.  Since that time she is doing better.  Still have more energy but still working a lot.  She also noticed that since 2020 she does have a swelling of the left arm that happened after COVID-19 vaccination. ? ?Past Medical History:  ?Diagnosis Date  ? Atypical chest pain 07/02/2015  ? Diabetes mellitus without complication ([place])   ? diet controlled  ? 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 Inappropriate sinus tachycardia 10/13/2016  ? 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 Palpitations 07/02/2015  ? Postmenopausal bleeding 09/15/2016  ? Shortness of breath 07/02/2015  ? Tachycardia   ? ? ?Past Surgical History:  ?Procedure Laterality Date  ? CHOLECYSTECTOMY    ? KNEE SURGERY Left   ? left salpingoophorectomy Left   ? TMJ ARTHROPLASTY Right   ? TUBAL LIGATION    ? ? ?Current Medications: ?Current Meds  ?Medication Sig  ? aspirin EC 81 MG tablet Take 1 tablet (81 mg total) by mouth daily. Swallow whole.  ? metFORMIN (GLUCOPHAGE) 500 MG tablet Take 500 mg by mouth 2 (two) times daily.  ? metoprolol tartrate (LOPRESSOR) 25 MG tablet Take 1 tablet (25 mg total) by mouth 2 (two) times daily.  ? OVER THE COUNTER MEDICATION Curamed supplement 1 capsule daily  ?  ? ?Allergies:   Other and Codeine  ? ?Social History  ? ?Socioeconomic  History  ? Marital status: Married  ?  Spouse name: Not on file  ? Number of children: Not on file  ? Years of education: Not on file  ? Highest education level: Not on file  ?Occupational History  ? Not on file  ?Tobacco Use  ? Smoking status: Never  ? Smokeless tobacco: Never  ?Substance and Sexual Activity  ? Alcohol use: Never  ? Drug use: Never  ? Sexual activity: Yes  ?Other Topics Concern  ? Not on file  ?Social History Narrative  ? ** Merged History Encounter **  ?    ? ?Social Determinants of Health  ? ?Financial Resource Strain: Not on file  ?Food Insecurity: Not on file  ?Transportation Needs: Not on file  ?Physical Activity: Not on file  ?Stress: Not on file  ?Social Connections: Not on file  ?  ? ?Family History: ?The patient's family history includes CAD in her father, maternal grandfather, and sister; Cancer in her maternal aunt, mother, and sister; Diabetes in her maternal grandmother, mother, and sister. ?ROS:   ?Please see the history of present illness.    ?All 14 point review of systems negative except as described per history of present illness ? ?EKGs/Labs/Other Studies Reviewed:   ? ? ? ?Recent Labs: ?11/20/2021: ALT 33; BUN 13; Creatinine 0.8;  Hemoglobin 13.2; Platelets 273; Potassium 3.7; Sodium 141  ?Recent Lipid Panel ?   ?Component Value Date/Time  ? CHOL 166 07/18/2021 1023  ? TRIG 179 (H) 07/18/2021 1023  ? HDL 51 07/18/2021 1023  ? CHOLHDL 3.3 07/18/2021 1023  ? CHOLHDL 5.0 05/20/2021 1050  ? VLDL 40 05/20/2021 1050  ? [place] 85 07/18/2021 1023  ? ? ?Physical Exam:   ? ?VS:  BP 118/88 (BP Location: Right Arm, Patient Position: Sitting)   Pulse 77   Ht '5\' 8"'$  (1.727 m)   Wt 208 lb 3.2 oz (94.4 kg)   SpO2 97%   BMI 31.66 kg/m?    ? ?Wt Readings from Last 3 Encounters:  ?12/18/21 208 lb 3.2 oz (94.4 kg)  ?11/26/21 208 lb 14.4 oz (94.8 kg)  ?11/20/21 205 lb (93 kg)  ?  ? ?GEN:  Well nourished, well developed in no acute distress ?HEENT: Normal ?NECK: No JVD; No carotid  bruits ?LYMPHATICS: No lymphadenopathy ?CARDIAC: RRR, no murmurs, no rubs, no gallops ?RESPIRATORY:  Clear to auscultation without rales, wheezing or rhonchi  ?ABDOMEN: Soft, non-tender, non-distended ?MUSCULOSKELETAL:  No edema; No deformity  ?SKIN: Warm and dry ?LOWER EXTREMITIES: no swelling ?NEUROLOGIC:  Alert and oriented x 3 ?PSYCHIATRIC:  Normal affect  ? ?ASSESSMENT:   ? ?1. Inappropriate sinus tachycardia   ?2. Diabetes mellitus without complication ([place])   ?3. Shortness of breath   ?4. Atypical chest pain   ? ?PLAN:   ? ?In order of problems listed above: ? ?Inappropriate sinus tachycardia seems to be under control today, her heart rate is 77.  She is doing well overall EKG shows sinus rhythm at rate of 76.  She is on small dose of beta-blocker which I will continue. ?Diabetes mellitus to be followed by internal medicine team last hemoglobin A1c 6.0 which is from 12/17/2020 ?Swelling of left upper extremity.  Being evaluated by oncology team.  Look like slight lymphedema.  Does not bother him much. ?Atypical chest pain denies having any right now.  Calcium score 0. ?Dyslipidemia will continue with Crestor 5 ? ? ?Medication Adjustments/Labs and Tests Ordered: ?Current medicines are reviewed at length with the patient today.  Concerns regarding medicines are outlined above.  ?No orders of the defined types were placed in this encounter. ? ?Medication changes: No orders of the defined types were placed in this encounter. ? ? ?Signed, ?Park Liter, MD, [facility] ?12/18/2021 10:43 AM    ?[place] ? ?

## 2021-12-18 NOTE — Addendum Note (Signed)
Addended by: Jerl Santos R on: 12/18/2021 10:49 AM ? ? Modules accepted: Orders ? ?

## 2022-04-02 ENCOUNTER — Telehealth: Payer: Self-pay | Admitting: Cardiology

## 2022-04-02 NOTE — Telephone Encounter (Signed)
STAT if HR is under 50 or over 120 (normal HR is 60-100 beats per minute)  What is your heart rate?  150 - Yesterday  Do you have a log of your heart rate readings (document readings)?  114, 113   Do you have any other symptoms?  No

## 2022-04-02 NOTE — Telephone Encounter (Signed)
Patient called stating that her heart rate had been elevated yesterday. 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 When she was just walking yesterday her heart rate was in the 120's. Today at the time of this call her heart rate is 83 but she has been resting for a couple of hours. She denies any shortness of breath or chest pain at this time. I will send this to Dr. Agustin Cree for guidance.

## 2022-04-04 ENCOUNTER — Other Ambulatory Visit: Payer: Self-pay

## 2022-04-04 DIAGNOSIS — R000 Tachycardia, unspecified: Secondary | ICD-10-CM

## 2022-04-04 DIAGNOSIS — R002 Palpitations: Secondary | ICD-10-CM

## 2022-04-04 NOTE — Telephone Encounter (Signed)
Called patient and informed her of Dr. Wendy Poet recommendation to wear a zio heart monitor for two weeks. The patient was agreeable to this plan and a monitor appointment was scheduled for the patient at 2:00 pm on 04-11-22. The patient was made aware and had no further questions at this time.

## 2022-04-11 ENCOUNTER — Ambulatory Visit (INDEPENDENT_AMBULATORY_CARE_PROVIDER_SITE_OTHER): Payer: Self-pay

## 2022-04-11 DIAGNOSIS — R002 Palpitations: Secondary | ICD-10-CM

## 2022-04-23 ENCOUNTER — Other Ambulatory Visit: Payer: Self-pay | Admitting: Hematology and Oncology

## 2022-05-02 IMAGING — CT CT CARDIAC CORONARY ARTERY CALCIUM SCORE
3 series · 14 of 20 positions shown, 16 images · non-contrast
Comparison: CT of the chest on 06/01/2013
COMPARISON: CT of the chest on 06/01/2013

Addendum:
EXAM:
OVER-READ INTERPRETATION  CT CHEST

The following report is an over-read performed by radiologist Dr.
Amit Kr Assumi [REDACTED] on 07/16/2021. This
over-read does not include interpretation of cardiac or coronary
anatomy or pathology. The coronary calcium score interpretation by
the cardiologist is attached.
CLINICAL DATA: Risk stratification
Coronary Calcium Score
TECHNIQUE: The patient was scanned on a Siemens Force scanner. Axial
non-contrast 3 mm slices were carried out through the heart. The
data set was analyzed on a dedicated work station and scored using
the Agatson method.

[Series 2: cascseq 2.0 sa36 70% (id) · axial · 0.39mm/px · z∈[-266,-164]mm · 4 of 84 slices shown]
[im 17/84  vessel]
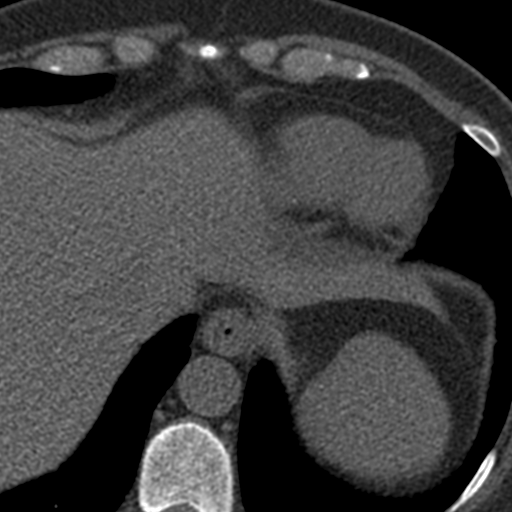
[im 34/84  vessel]
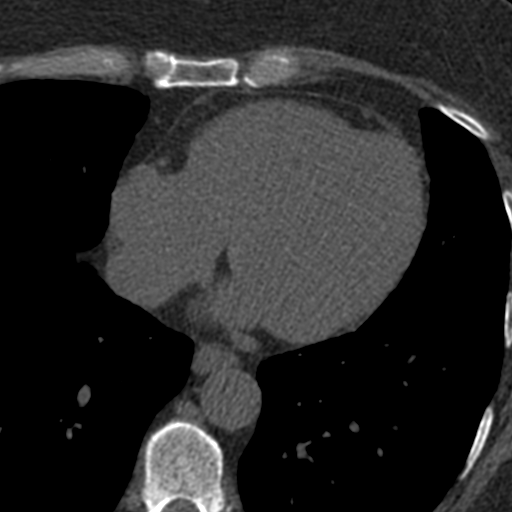
[im 50/84  vessel]
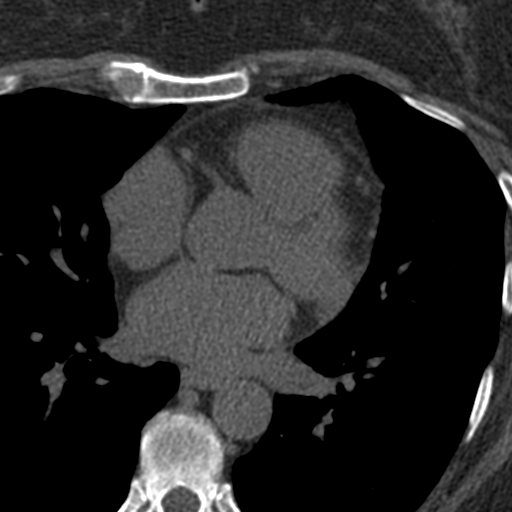
[im 67/84  vessel]
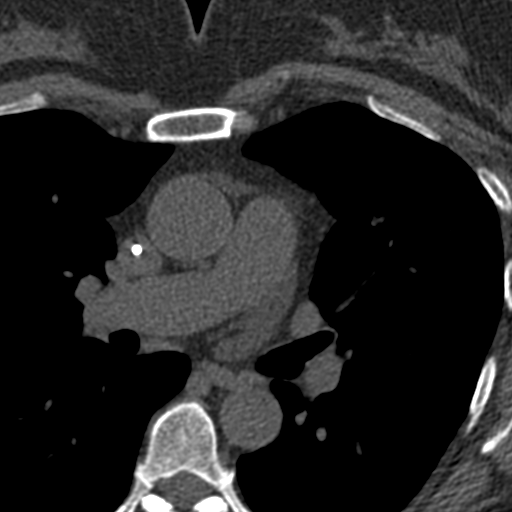

[Series 3: cascseq 2.0 bf37 st · axial · 0.66mm/px · z∈[-270,-158]mm · 5 of 85 slices shown, 7 images]
[im 15/85  vessel]
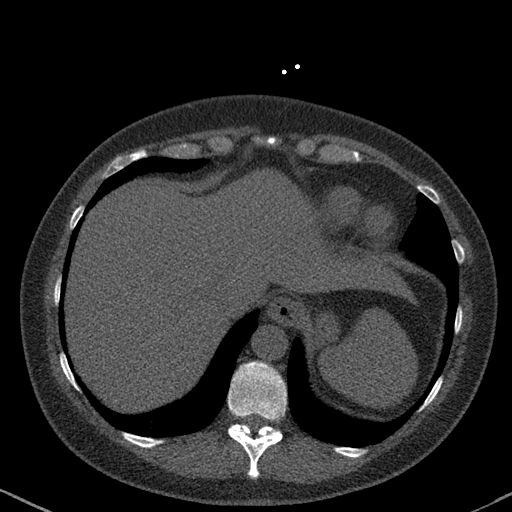
[im 15/85  lung]
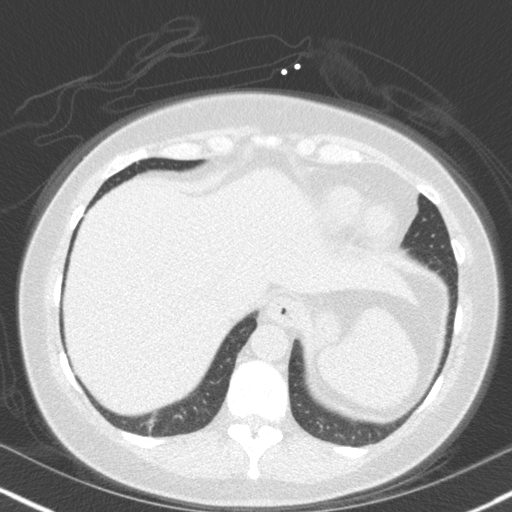
[im 29/85  vessel]
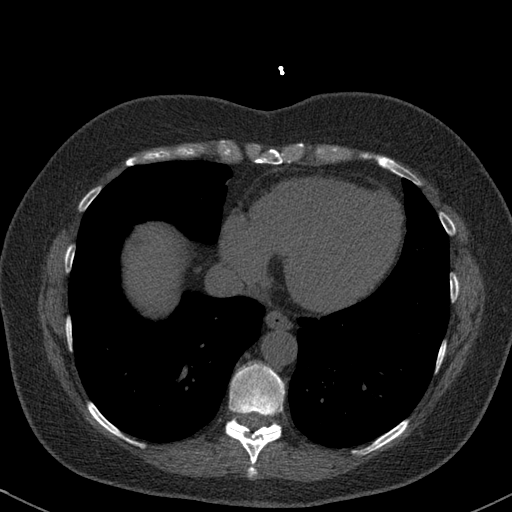
[im 43/85  vessel]
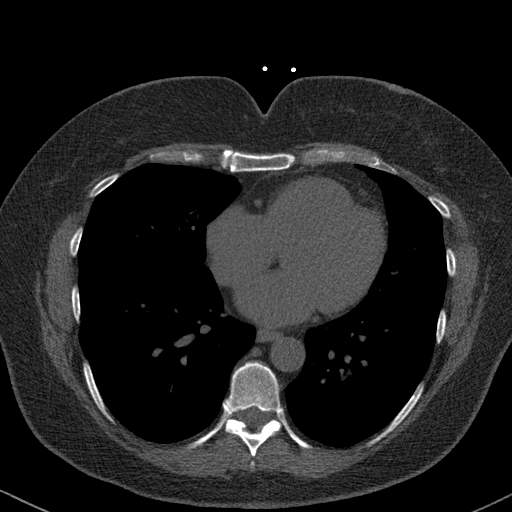
[im 57/85  vessel]
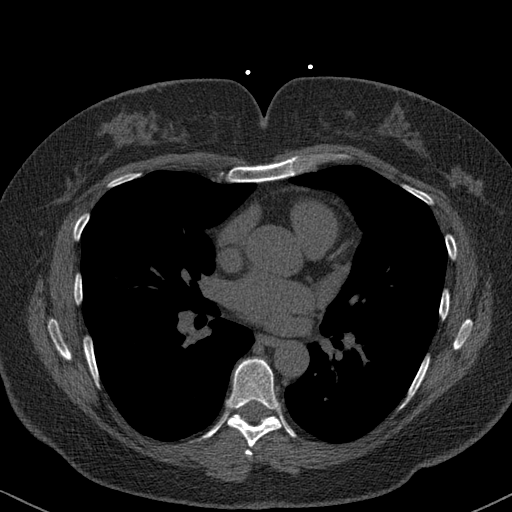
[im 71/85  vessel]
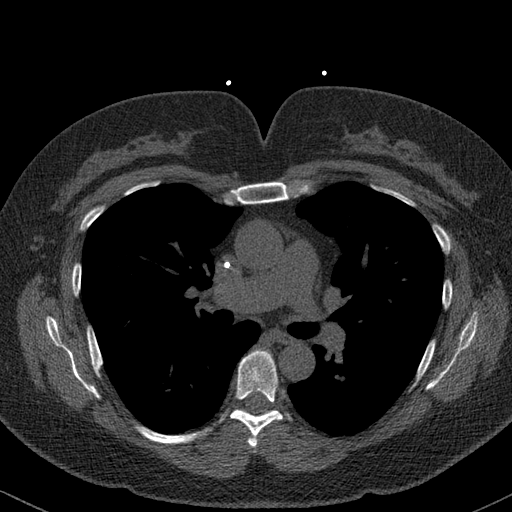
[im 71/85  lung]
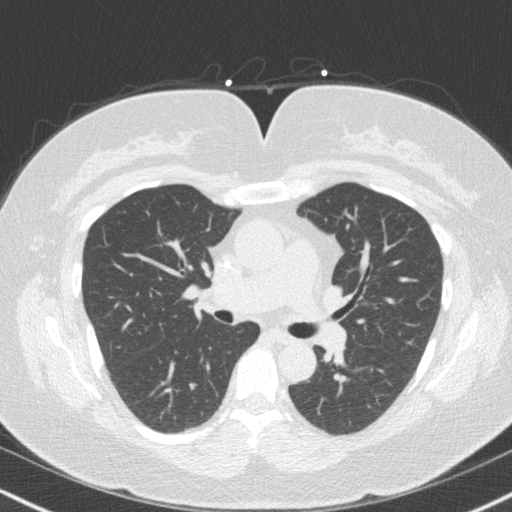

[Series 4: cascseq 2.0 br59 lung · axial · 0.66mm/px · z∈[-270,-158]mm · 5 of 85 slices shown]
[im 15/85  lung]
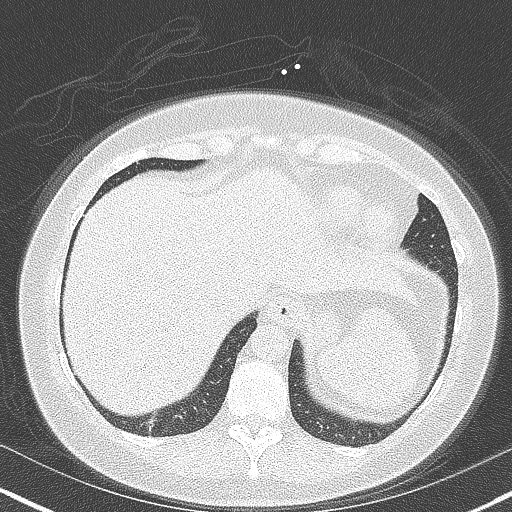
[im 29/85  lung]
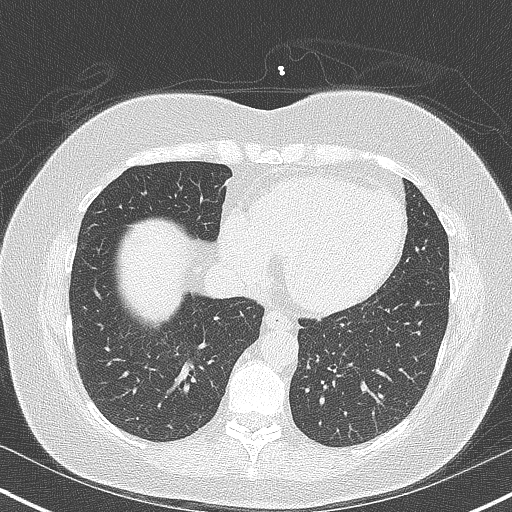
[im 43/85  lung]
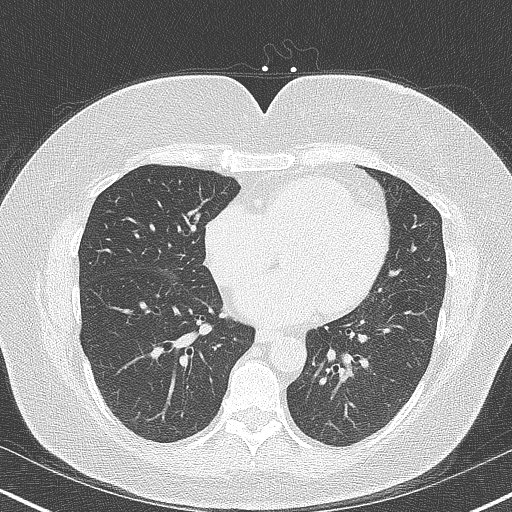
[im 57/85  lung]
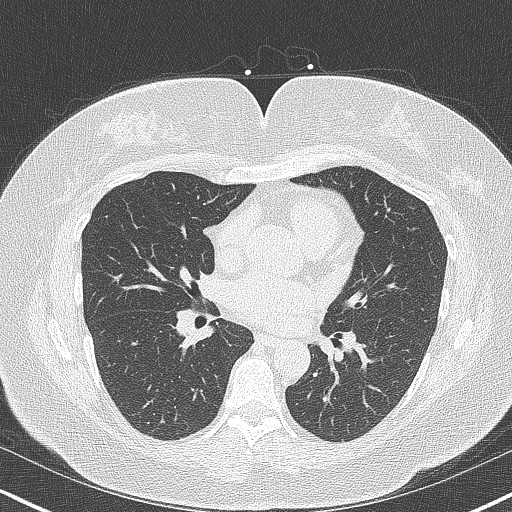
[im 71/85  lung]
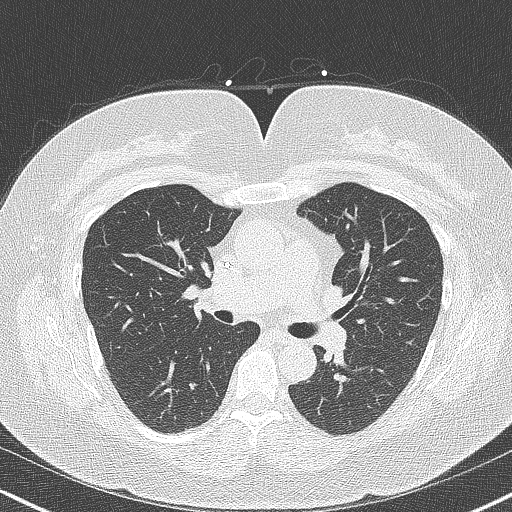

[14 of 20 positions shown; findings below may reference images not displayed]

FINDINGS: Vascular: Port-A-Cath present. No significant noncardiac vascular
findings.

Mediastinum/Nodes: Visualized mediastinum and hilar regions
demonstrate no lymphadenopathy or masses.

Lungs/Pleura: Visualized lungs show no evidence of pulmonary edema,
consolidation, pneumothorax, nodule or pleural fluid.

Upper Abdomen: No acute abnormality.

Musculoskeletal: No chest wall mass or suspicious bone lesions
identified.
IMPRESSION: No significant incidental findings.  Indwelling Port-A-Cath present.
FINDINGS: Non-cardiac: See separate report from [REDACTED].

Ascending Aorta: No calcifications

Pericardium: Normal
IMPRESSION: Coronary calcium score of 0 . This was 0 percentile for age and sex
matched control.

*** End of Addendum ***
EXAM:
OVER-READ INTERPRETATION  CT CHEST

The following report is an over-read performed by radiologist Dr.
Amit Kr Assumi [REDACTED] on 07/16/2021. This
over-read does not include interpretation of cardiac or coronary
anatomy or pathology. The coronary calcium score interpretation by
the cardiologist is attached.
FINDINGS: Vascular: Port-A-Cath present. No significant noncardiac vascular
findings.

Mediastinum/Nodes: Visualized mediastinum and hilar regions
demonstrate no lymphadenopathy or masses.

Lungs/Pleura: Visualized lungs show no evidence of pulmonary edema,
consolidation, pneumothorax, nodule or pleural fluid.

Upper Abdomen: No acute abnormality.

Musculoskeletal: No chest wall mass or suspicious bone lesions
identified.
IMPRESSION: No significant incidental findings.  Indwelling Port-A-Cath present.

## 2022-05-09 ENCOUNTER — Other Ambulatory Visit: Payer: Self-pay | Admitting: Hematology and Oncology

## 2022-05-09 ENCOUNTER — Inpatient Hospital Stay: Payer: Self-pay | Attending: Oncology

## 2022-05-09 DIAGNOSIS — Z8543 Personal history of malignant neoplasm of ovary: Secondary | ICD-10-CM | POA: Insufficient documentation

## 2022-05-09 DIAGNOSIS — C562 Malignant neoplasm of left ovary: Secondary | ICD-10-CM

## 2022-05-09 LAB — BASIC METABOLIC PANEL
BUN: 18 (ref 4–21)
CO2: 25 — AB (ref 13–22)
Chloride: 107 (ref 99–108)
Creatinine: 0.8 (ref 0.5–1.1)
Glucose: 111
Potassium: 3.5 mEq/L (ref 3.5–5.1)
Sodium: 140 (ref 137–147)

## 2022-05-09 LAB — CBC: RBC: 4.53 (ref 3.87–5.11)

## 2022-05-09 LAB — HEPATIC FUNCTION PANEL
ALT: 42 U/L — AB (ref 7–35)
AST: 40 — AB (ref 13–35)
Alkaline Phosphatase: 101 (ref 25–125)
Bilirubin, Total: 0.6

## 2022-05-09 LAB — CBC AND DIFFERENTIAL
HCT: 39 (ref 36–46)
Hemoglobin: 12.7 (ref 12.0–16.0)
Neutrophils Absolute: 5.77
Platelets: 282 10*3/uL (ref 150–400)
WBC: 10.3

## 2022-05-09 LAB — COMPREHENSIVE METABOLIC PANEL
Albumin: 4.3 (ref 3.5–5.0)
Calcium: 9.3 (ref 8.7–10.7)

## 2022-05-11 NOTE — Progress Notes (Signed)
[address]  [address] 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  She was treated with a left salpingo-oophorectomy, as the lesion appeared cystic.  Pathology revealed a 9 cm granulosa cell tumor with rupture of the capsule.  She received adjuvant chemotherapy with 4 cycles of bleomycin, etoposide and cisplatin.  She did not receive all of the bleomycin doses because of respiratory problems, which included infection and pulmonary emboli.  She was treated with Coumadin for 6 months for the pulmonary emboli.  She had multiple toxicities from chemotherapy and required an admission for febrile neutropenia.  Due to her history of ovarian cancer, she underwent testing for hereditary cancer syndromes with the Myriad myRisk Hereditary Cancer Panel test.  This did not reveal any clinically significant mutation or any variants of uncertain significance.  She has a right ovarian cyst, which is being followed, as the patient decided to delay right salpingo-oophorectomy and hysterectomy.  She was found to have a low B12 level and was initially on monthly injections, then transitioned to oral.  She has also had mild chronic hypokalemia, but does not like to take a potassium supplement.  She has also had mild elevation of the liver transaminases, which have fluctuated up and down. CT abdomen and pelvis in January 2019 revealed a stable right ovarian cyst, normal appearing uterus, and no evidence of recurrent/metastatic disease.  Screening mammography from February 2022 revealed a possible distortion in the left breast.  Therefore diagnostic  unilateral left mammogram and ultrasound were pursued in March which confirmed no evidence of malignancy.   INTERVAL HISTORY:  Nicole Beck is here for routine follow up and states that she has no PCP since she was dismissed from Dr. Felipa Emory practice, so requests that I refill her metformin for 1 month while she establishes with another one. Otherwise, she has been well and denies other complaints. Blood counts and chemistries are unremarkable. Inhibin B was normal <7.0, and AMH is still pending. Her  appetite is good, and she has gained 2 pounds since her last visit.  She denies fever, chills or other signs of infection.  She denies nausea, vomiting, bowel issues, or abdominal pain.  She denies sore throat, cough, dyspnea, or chest pain.  REVIEW OF SYSTEMS:  Review of Systems  Constitutional: Negative.  Negative for appetite change, chills, fatigue, fever and unexpected weight change.  HENT:  Negative.    Eyes: Negative.   Respiratory: Negative.  Negative for chest tightness, cough, hemoptysis, shortness of breath and wheezing.   Cardiovascular: Negative.  Negative for chest pain, leg swelling and palpitations.  Gastrointestinal: Negative.  Negative for abdominal distention, abdominal pain, blood in stool, constipation, diarrhea, nausea and vomiting.  Endocrine: Negative.   Genitourinary: Negative.  Negative for difficulty urinating, dysuria, frequency and hematuria.   Musculoskeletal: Negative.  Negative for arthralgias, back pain, flank pain, gait problem and myalgias.       Mild swelling of the left axilla/upper extremity  Skin: Negative.   Neurological: Negative.  Negative for dizziness, extremity weakness, gait problem, headaches, light-headedness, numbness, seizures and speech difficulty.  Hematological: Negative.  Psychiatric/Behavioral: Negative.  Negative for depression and sleep disturbance. The patient is not nervous/anxious.      VITALS:  Blood pressure (!) 161/86, pulse 80,  temperature 97.7 F (36.5 C), temperature source Oral, resp. rate 17, height $RemoveBe'5\' 8"'OkewRgnvv$  (1.727 m), weight 210 lb 11.2 oz (95.6 kg), SpO2 97 %.  Wt Readings from Last 3 Encounters:  05/12/22 210 lb 11.2 oz (95.6 kg)  05/09/22 208 lb 14.4 oz (94.8 kg)  12/18/21 208 lb 3.2 oz (94.4 kg)    Body mass index is 32.04 kg/m.  Performance status (ECOG): 0 - Asymptomatic  PHYSICAL EXAM:  Physical Exam Constitutional:      General: She is not in acute distress.    Appearance: Normal appearance. She is normal weight.  HENT:     Head: Normocephalic and atraumatic.  Eyes:     General: No scleral icterus.    Extraocular Movements: Extraocular movements intact.     Conjunctiva/sclera: Conjunctivae normal.     Pupils: Pupils are equal, round, and reactive to light.  Cardiovascular:     Rate and Rhythm: Normal rate and regular rhythm.     Pulses: Normal pulses.     Heart sounds: Normal heart sounds. No murmur heard.    No friction rub. No gallop.  Pulmonary:     Effort: Pulmonary effort is normal. No respiratory distress.     Breath sounds: Normal breath sounds.  Chest:     Comments: A few cysts in the lower outer quadrant of the left breast, which are non-tender and less than 1 cm. Right breast has fibrocystic changes of the lower portion. Both breasts are without masses. Abdominal:     General: Bowel sounds are normal. There is no distension.     Palpations: Abdomen is soft. There is no hepatomegaly, splenomegaly or mass.     Tenderness: There is no abdominal tenderness.  Musculoskeletal:        General: Normal range of motion.     Cervical back: Normal range of motion and neck supple.     Right lower leg: No edema.     Left lower leg: No edema.     Comments: I measured her upper extremity 5 inches below the [facility] joint and the right measures 37 cm, and left measures 34.5 cm.  Lymphadenopathy:     Cervical: No cervical adenopathy.  Skin:    General: Skin is warm and dry.  Neurological:      General: No focal deficit present.     Mental Status: She is alert and oriented to person, place, and time. Mental status is at baseline.  Psychiatric:        Mood and Affect: Mood normal.        Behavior: Behavior normal.        Thought Content: Thought content normal.        Judgment: Judgment normal.     LABS:      Latest Ref Rng & Units 05/09/2022   12:00 AM 11/20/2021   12:00 AM 05/20/2021   12:00 AM  CBC  WBC  10.3     9.4  8.2   Hemoglobin 12.0 - 16.0 12.7     13.2  13.2   Hematocrit 36 - 46 39     40  40   Platelets 150 - 400 K/uL 282     273  244      This result is from an external source.      Latest Ref Rng &  Units 05/09/2022   12:00 AM 11/20/2021   12:00 AM 05/20/2021   12:00 AM  CMP  BUN 4 - 21 18     13  17    Creatinine 0.5 - 1.1 0.8     0.8  0.8   Sodium 137 - 147 140     141  141   Potassium 3.5 - 5.1 mEq/L 3.5     3.7  3.4   Chloride 99 - 108 107     107  104   CO2 13 - 22 25     27  25    Calcium 8.7 - 10.7 9.3     8.9  9.1   Alkaline Phos 25 - 125 101     97  100   AST 13 - 35 40     30  34   ALT 7 - 35 U/L 42     33  28      This result is from an external source.    STUDIES:  LONG TERM MONITOR (3-14 DAYS)  Result Date: 05/21/2022 Patch Wear Time:  13 days and 17 hours (2023-07-14T14:21:48-0400 to 2023-07-28T07:36:52-0400) Patient had a min HR of 47 bpm, max HR of 222 bpm, and avg HR of 88 bpm. Predominant underlying rhythm was Sinus Rhythm. 1 run of Supraventricular Tachycardia occurred lasting 4 beats with a max rate of 222 bpm (avg 173 bpm). Isolated SVEs were rare (<1.0%), and no SVE Couplets or SVE Triplets were present. Isolated VEs were rare (<1.0%), and no VE Couplets or VE Triplets were present. Summary and conclusions: 1 run of supraventricular tachycardia only 4 beats asymptomatic Multiple triggered events for sinus tachycardia     Allergies:  Allergies  Allergen Reactions   Other Nausea And Vomiting and Other (See Comments)    Real  strong antibiotics   Codeine Nausea And Vomiting and Itching    Current Medications: Current Outpatient Medications  Medication Sig Dispense Refill   aspirin EC 81 MG tablet Take 1 tablet (81 mg total) by mouth daily. Swallow whole. 90 tablet 3   metFORMIN (GLUCOPHAGE) 500 MG tablet Take 1 tablet (500 mg total) by mouth 2 (two) times daily. 60 tablet 0   metoprolol tartrate (LOPRESSOR) 25 MG tablet Take 1 tablet (25 mg total) by mouth 2 (two) times daily. 180 tablet 3   OVER THE COUNTER MEDICATION Curamed supplement 1 capsule daily     rosuvastatin (CRESTOR) 5 MG tablet Take 1 tablet (5 mg total) by mouth daily. 90 tablet 3   No current facility-administered medications for this visit.   Facility-Administered Medications Ordered in Other Visits  Medication Dose Route Frequency Provider Last Rate Last Admin   sodium chloride flush (NS) 0.9 % injection 10 mL  10 mL Intracatheter PRN [place] Scrape A, NP   10 mL at 11/20/21 1052     ASSESSMENT & PLAN:   Assessment:   1. 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  She remains without evidence of recurrence.  2.  Mild lymphedema of the left upper extremity and axilla of unknown etiology. She states that this occurred after she received her COVID injection years ago. I gave her a booklet regarding lymphedema, the precautions to take as well as symptoms management. I will plan to refer her to Amy, the lymphedema specialist as well.  Plan: Her port was flushed last week with her lab work.Otherwise, we will see her back in 7 months  with bilateral screening mammogram, CBC, CMP, Inhibin B and AMH for repeat evaluation and port flush. The patient understands the plans discussed today and is in agreement with them.  She knows to contact our office if she develops concerns prior to her next appointment.   I provided 15 minutes of face-to-face time during this this encounter and > 50% was  spent counseling as documented under my assessment and plan.    Derwood Kaplan, MD [facility] [address] [place] [address] Dept: [phone] Dept Fax: [phone]

## 2022-05-12 ENCOUNTER — Inpatient Hospital Stay (HOSPITAL_BASED_OUTPATIENT_CLINIC_OR_DEPARTMENT_OTHER): Payer: Self-pay | Admitting: Oncology

## 2022-05-12 ENCOUNTER — Encounter: Payer: Self-pay | Admitting: Oncology

## 2022-05-12 ENCOUNTER — Other Ambulatory Visit: Payer: Self-pay | Admitting: Oncology

## 2022-05-12 ENCOUNTER — Telehealth: Payer: Self-pay | Admitting: Cardiology

## 2022-05-12 VITALS — BP 161/86 | HR 80 | Temp 97.7°F | Resp 17 | Ht 68.0 in | Wt 210.7 lb

## 2022-05-12 DIAGNOSIS — C562 Malignant neoplasm of left ovary: Secondary | ICD-10-CM

## 2022-05-12 DIAGNOSIS — D126 Benign neoplasm of colon, unspecified: Secondary | ICD-10-CM

## 2022-05-12 DIAGNOSIS — Z1239 Encounter for other screening for malignant neoplasm of breast: Secondary | ICD-10-CM

## 2022-05-12 DIAGNOSIS — E119 Type 2 diabetes mellitus without complications: Secondary | ICD-10-CM

## 2022-05-12 LAB — INHIBIN B: Inhibin B: <7 pg/mL

## 2022-05-12 MED ORDER — METFORMIN HCL 500 MG PO TABS: 500.0000 mg | ORAL_TABLET | Freq: Two times a day (BID) | ORAL | 0 refills | Status: DC

## 2022-05-12 NOTE — Telephone Encounter (Signed)
Spoke with pt about monitor results. Advised that Dr. Agustin Cree had not looked at results yet and we would call with his comments soon.

## 2022-05-12 NOTE — Telephone Encounter (Signed)
Patient is requesting a call back to discuss monitor results. 

## 2022-05-19 NOTE — Telephone Encounter (Signed)
Pt is requesting call back in regards to her heart monitor results.

## 2022-05-19 NOTE — Telephone Encounter (Signed)
Results reviewed with pt as per Dr. Krasowski's note.  Pt verbalized understanding and had no additional questions. Routed to PCP  

## 2022-05-26 ENCOUNTER — Ambulatory Visit: Payer: Self-pay | Admitting: Oncology

## 2022-05-26 ENCOUNTER — Other Ambulatory Visit: Payer: Self-pay

## 2022-05-26 ENCOUNTER — Ambulatory Visit: Payer: Self-pay | Admitting: Hematology and Oncology

## 2022-05-29 ENCOUNTER — Telehealth: Payer: Self-pay | Admitting: Cardiology

## 2022-05-29 ENCOUNTER — Telehealth: Payer: Self-pay

## 2022-05-29 NOTE — Telephone Encounter (Signed)
LVM per DPR regarding monitor results. Encouraged pt to call if she has any questions or concerns.

## 2022-05-29 NOTE — Telephone Encounter (Signed)
Follow Up:       Patient's is retuning call from today, concerning her test results.

## 2022-06-05 ENCOUNTER — Encounter: Payer: Self-pay | Admitting: Hematology and Oncology

## 2022-06-24 ENCOUNTER — Ambulatory Visit: Payer: Self-pay | Attending: Cardiology | Admitting: Cardiology

## 2022-06-24 ENCOUNTER — Encounter: Payer: Self-pay | Admitting: Cardiology

## 2022-06-24 VITALS — BP 136/90 | HR 88 | Ht 68.0 in | Wt 213.4 lb

## 2022-06-24 DIAGNOSIS — E119 Type 2 diabetes mellitus without complications: Secondary | ICD-10-CM

## 2022-06-24 DIAGNOSIS — R000 Tachycardia, unspecified: Secondary | ICD-10-CM

## 2022-06-24 DIAGNOSIS — E785 Hyperlipidemia, unspecified: Secondary | ICD-10-CM

## 2022-06-24 DIAGNOSIS — R002 Palpitations: Secondary | ICD-10-CM

## 2022-06-24 HISTORY — DX: Hyperlipidemia, unspecified: E78.5

## 2022-06-24 MED ORDER — METOPROLOL TARTRATE 25 MG PO TABS: 25.0000 mg | ORAL_TABLET | Freq: Two times a day (BID) | ORAL | 3 refills | Status: DC

## 2022-06-24 MED ORDER — ROSUVASTATIN CALCIUM 5 MG PO TABS: 5.0000 mg | ORAL_TABLET | Freq: Every day | ORAL | 3 refills | Status: DC

## 2022-06-24 NOTE — Progress Notes (Unsigned)
Cardiology Office Note:    Date:  06/24/2022   ID:  Nicole Beck, DOB [birth year], MRN [id number]  PCP:  Cher Nakai, MD  Cardiologist:  Jenne Campus, MD    Referring MD: Haydee Salter, NP   Chief Complaint  Patient presents with   Follow-up   Medication Refill    All cardiac meds    History of Present Illness:    Nicole Beck is a 56 y.o. female with past medical history significant for inappropriate sinus tachycardia, essential hypertension, dyslipidemia, borderline diabetes.  She comes today 2 months for follow-up she is disappointed with herself because she gained some weight describes a Rare palpitation that does not bother her too much.  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 Swallow whole.   cephALEXin (KEFLEX) 500 MG capsule Take 500 mg by mouth 3 (three) times daily. 06/19/22 for 10 days   metFORMIN (GLUCOPHAGE) 500 MG tablet Take 1 tablet (500 mg total) by mouth 2 (two) times daily.   metoprolol tartrate (LOPRESSOR) 25 MG tablet Take 1 tablet (25 mg total) by mouth 2 (two) times daily.   OVER THE COUNTER MEDICATION Take 1 Capful by mouth daily. Curamed supplement 1 capsule daily   rosuvastatin (CRESTOR) 5 MG tablet Take 1 tablet (5 mg  total) by mouth daily.     Allergies:   Other and Codeine   Social History   Socioeconomic History   Marital status: Married    Spouse name: Not on file   Number of children: Not on file   Years of education: Not on file   Highest education level: Not on file  Occupational History   Not on file  Tobacco Use   Smoking status: Never   Smokeless tobacco: Never  Substance and Sexual Activity   Alcohol use: Never   Drug use: Never   Sexual activity: Yes  Other Topics Concern   Not on file  Social History Narrative   ** Merged History Encounter **       Social Determinants of Health   Financial Resource Strain: Not on file  Food Insecurity: Not on file  Transportation Needs: Not on file  Physical Activity: Not on file  Stress: Not on file  Social Connections: Not on file     Family History: The patient's family history includes CAD in her father, maternal grandfather, and sister; Cancer in her maternal aunt, mother, and sister; Diabetes in her maternal grandmother, mother, and sister. ROS:   Please see the history of present illness.    All 14 point review of systems negative except as described per history of present illness  EKGs/Labs/Other Studies Reviewed:      Recent Labs: 05/09/2022: ALT  42; BUN 18; Creatinine 0.8; Hemoglobin 12.7; Platelets 282; Potassium 3.5; Sodium 140  Recent Lipid Panel    Component Value Date/Time   CHOL 166 07/18/2021 1023   TRIG 179 (H) 07/18/2021 1023   HDL 51 07/18/2021 1023   CHOLHDL 3.3 07/18/2021 1023   CHOLHDL 5.0 05/20/2021 1050   VLDL 40 05/20/2021 1050   LDLCALC 85 07/18/2021 1023    Physical Exam:    VS:  BP (!) 136/90 (BP Location: Left Arm, Patient Position: Sitting)   Pulse 88   Ht '5\' 8"'$  (1.727 m)   Wt 213 lb 6.4 oz (96.8 kg)   SpO2 97%   BMI 32.45 kg/m     Wt Readings from Last 3 Encounters:  06/24/22 213 lb 6.4 oz (96.8 kg)  05/12/22 210 lb 11.2 oz (95.6 kg)  05/09/22 208 lb 14.4 oz (94.8 kg)     GEN:   Well nourished, well developed in no acute distress HEENT: Normal NECK: No JVD; No carotid bruits LYMPHATICS: No lymphadenopathy CARDIAC: RRR, no murmurs, no rubs, no gallops RESPIRATORY:  Clear to auscultation without rales, wheezing or rhonchi  ABDOMEN: Soft, non-tender, non-distended MUSCULOSKELETAL:  No edema; No deformity  SKIN: Warm and dry LOWER EXTREMITIES: no swelling NEUROLOGIC:  Alert and oriented x 3 PSYCHIATRIC:  Normal affect   ASSESSMENT:    1. Inappropriate sinus tachycardia   2. Diabetes mellitus without complication (HCC)   3. Palpitations   4. Dyslipidemia    PLAN:    In order of problems listed above:  Inappropriate sinus tachycardia control with beta-blocker.  We will continue present management. Diabetes mellitus that being followed by internal medicine team I did review K PN which only her hemoglobin A1c of 6.0 this is from March thousand 22.  I will continue present management she is followed by internal medicine team she will have hemoglobin A1c repeated next week Dyslipidemia I did look at her K PN which show me HDL of 51 LDL 85.  She is on Crestor 5 she is scheduled to see primary care physician next week we will wait for results of another fasting lipid profile before making decision about adding some medications Palpitations very few and far in between she described to have some palpitations when she was in a very hot day carrying some boxes.  I explained to her that was probably related to dehydration and simply the fact that she was doing some effort   Medication Adjustments/Labs and Tests Ordered: Current medicines are reviewed at length with the patient today.  Concerns regarding medicines are outlined above.  No orders of the defined types were placed in this encounter.  Medication changes: No orders of the defined types were placed in this encounter.   Signed, Park Liter, MD, [facility] 06/24/2022 1:18 PM    [facility]

## 2022-06-24 NOTE — Patient Instructions (Signed)

## 2022-08-28 ENCOUNTER — Telehealth: Payer: Self-pay | Admitting: Cardiology

## 2022-08-28 MED ORDER — METOPROLOL TARTRATE 25 MG PO TABS: 25.0000 mg | ORAL_TABLET | Freq: Two times a day (BID) | ORAL | 2 refills | Status: DC

## 2022-08-28 MED ORDER — ASPIRIN 81 MG PO TBEC: 81.0000 mg | DELAYED_RELEASE_TABLET | Freq: Every day | ORAL | 2 refills | Status: DC

## 2022-08-28 NOTE — Telephone Encounter (Signed)
*  STAT* If patient is at the pharmacy, call can be transferred to refill team.   1. Which medications need to be refilled? (please list name of each medication and dose if known)  aspirin EC 81 MG tablet metoprolol tartrate (LOPRESSOR) 25 MG tablet  2. Which pharmacy/location (including street and city if local pharmacy) is medication to be sent to? [id number] [place], [place] - [address] [place]  3. Do they need a 30 day or 90 day supply? 90 day supply for both

## 2022-08-28 NOTE — Telephone Encounter (Signed)
Refills of Aspirin 81 mg and Metoprolol Tartrate 25 mg sent to [facility] [place], [place]

## 2022-12-12 ENCOUNTER — Inpatient Hospital Stay: Payer: BLUE CROSS/BLUE SHIELD | Attending: Oncology | Admitting: Oncology

## 2022-12-12 ENCOUNTER — Other Ambulatory Visit: Payer: Self-pay

## 2022-12-12 ENCOUNTER — Inpatient Hospital Stay: Payer: BLUE CROSS/BLUE SHIELD

## 2022-12-12 ENCOUNTER — Encounter: Payer: Self-pay | Admitting: Hematology and Oncology

## 2022-12-12 ENCOUNTER — Other Ambulatory Visit: Payer: Self-pay | Admitting: Oncology

## 2022-12-12 ENCOUNTER — Encounter: Payer: Self-pay | Admitting: Oncology

## 2022-12-12 VITALS — BP 122/84 | HR 88 | Temp 98.1°F | Resp 20 | Ht 68.0 in | Wt 211.7 lb

## 2022-12-12 DIAGNOSIS — C562 Malignant neoplasm of left ovary: Secondary | ICD-10-CM

## 2022-12-12 DIAGNOSIS — D126 Benign neoplasm of colon, unspecified: Secondary | ICD-10-CM

## 2022-12-12 LAB — CBC WITH DIFFERENTIAL (CANCER CENTER ONLY)
Abs Immature Granulocytes: 0.03 10*3/uL (ref 0.00–0.07)
Basophils Absolute: 0.1 10*3/uL (ref 0.0–0.1)
Basophils Relative: 0 %
Eosinophils Absolute: 0.3 10*3/uL (ref 0.0–0.5)
Eosinophils Relative: 3 %
HCT: 41.4 % (ref 36.0–46.0)
Hemoglobin: 13 g/dL (ref 12.0–15.0)
Immature Granulocytes: 0 %
Lymphocytes Relative: 38 %
Lymphs Abs: 4.5 10*3/uL — ABNORMAL HIGH (ref 0.7–4.0)
MCH: 27.5 pg (ref 26.0–34.0)
MCHC: 31.4 g/dL (ref 30.0–36.0)
MCV: 87.5 fL (ref 80.0–100.0)
Monocytes Absolute: 0.7 10*3/uL (ref 0.1–1.0)
Monocytes Relative: 6 %
Neutro Abs: 6.1 10*3/uL (ref 1.7–7.7)
Neutrophils Relative %: 53 %
Platelet Count: 308 10*3/uL (ref 150–400)
RBC: 4.73 MIL/uL (ref 3.87–5.11)
RDW: 13.8 % (ref 11.5–15.5)
WBC Count: 11.7 10*3/uL — ABNORMAL HIGH (ref 4.0–10.5)
nRBC: 0 % (ref 0.0–0.2)

## 2022-12-12 LAB — CMP (CANCER CENTER ONLY)
ALT: 49 U/L — ABNORMAL HIGH (ref 0–44)
AST: 37 U/L (ref 15–41)
Albumin: 4.3 g/dL (ref 3.5–5.0)
Alkaline Phosphatase: 95 U/L (ref 38–126)
Anion gap: 10 (ref 5–15)
BUN: 18 mg/dL (ref 6–20)
CO2: 24 mmol/L (ref 22–32)
Calcium: 9.4 mg/dL (ref 8.9–10.3)
Chloride: 103 mmol/L (ref 98–111)
Creatinine: 0.72 mg/dL (ref 0.44–1.00)
GFR, Estimated: >60 mL/min (ref >60)
Glucose, Bld: 97 mg/dL (ref 70–99)
Potassium: 3.8 mmol/L (ref 3.5–5.1)
Sodium: 137 mmol/L (ref 135–145)
Total Bilirubin: 0.6 mg/dL (ref 0.3–1.2)
Total Protein: 8 g/dL (ref 6.5–8.1)

## 2022-12-12 LAB — HM MAMMOGRAPHY

## 2022-12-12 MED ORDER — SODIUM CHLORIDE 0.9% FLUSH
10.0000 mL | INTRAVENOUS | Status: DC | PRN
  Administered 2022-12-12: 10 mL

## 2022-12-12 MED ORDER — HEPARIN SOD (PORK) LOCK FLUSH 100 UNIT/ML IV SOLN
500.0000 [IU] | Freq: Once | INTRAVENOUS | Status: AC | PRN
  Administered 2022-12-12: 500 [IU]

## 2022-12-12 NOTE — Progress Notes (Signed)
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  She was treated with a left salpingo-oophorectomy, as the lesion appeared cystic.  Pathology revealed a 9 cm granulosa cell tumor with rupture of the capsule.  She received adjuvant chemotherapy with 4 cycles of bleomycin, etoposide and cisplatin.  She did not receive all of the bleomycin doses because of respiratory problems, which included infection and pulmonary emboli.  She was treated with Coumadin for 6 months for the pulmonary emboli.  She had multiple toxicities from chemotherapy and required an admission for febrile neutropenia.  Due to her history of ovarian cancer, she underwent testing for hereditary cancer syndromes with the Myriad myRisk Hereditary Cancer Panel test.  This did not reveal any clinically significant mutation or any variants of uncertain significance.  She has a right ovarian cyst, which is being followed, as the patient decided to delay right salpingo-oophorectomy and hysterectomy.  She was found to have a low B12 level and was initially on monthly injections, then transitioned to oral.  She has also had mild chronic hypokalemia, but does not like to take a potassium supplement.  She has also had mild elevation of the liver transaminases, which have fluctuated up and down. CT abdomen and pelvis in January 2019 revealed a stable right ovarian cyst, normal appearing uterus, and no evidence of recurrent/metastatic disease.  Screening mammography from February 2022 revealed a possible distortion in the left breast.  Therefore diagnostic  unilateral left mammogram and ultrasound were pursued in March which confirmed no evidence of malignancy.   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 Patient states that she is well and has no complaints of pain. Patient informed me that she was fatigued but changed her diet and it has now improved although she does not sleep well. She has known fatty liver with mild elevation of her liver transaminases. She will have her annual mammogram today 12/12/2022 at 4:45. She continues to follow-up Dr. Bing MatterKrasowski. Her labs today are pending. I will see her back in 6 months with CBC, CMP, AMH, and inhibin B with a port flush. She denies signs of infection such as sore throat, sinus drainage, cough, or urinary symptoms.  She denies fevers or recurrent chills. She denies pain. She denies nausea, vomiting, chest pain, dyspnea or cough. Her weight has decreased 2  pounds over last 5 months . She is accompanied by her mother today.   REVIEW OF SYSTEMS:  Review of Systems  Constitutional: Negative.  Negative for appetite change, chills, diaphoresis, fatigue, fever and unexpected weight change.  HENT:  Negative.  Negative for hearing loss, lump/mass, mouth sores, nosebleeds, sore throat, tinnitus, trouble swallowing and voice change.   Eyes: Negative.  Negative for eye problems and icterus.  Respiratory: Negative.  Negative for chest tightness, cough, hemoptysis, shortness of breath and wheezing.   Cardiovascular:  Negative for chest pain, leg swelling and palpitations.       Left arm swelling since receiving a vaccine last year.  Gastrointestinal: Negative.  Negative for abdominal distention, abdominal pain, blood in stool, constipation, diarrhea, nausea, rectal pain and vomiting.  Endocrine: Negative.   Genitourinary: Negative.  Negative for bladder incontinence, difficulty urinating, dyspareunia, dysuria, frequency, hematuria,  menstrual problem, nocturia, pelvic pain, vaginal bleeding and vaginal discharge.   Musculoskeletal: Negative.  Negative for arthralgias, back pain, flank pain, gait problem, myalgias, neck pain and neck stiffness.  Skin: Negative.  Negative for itching, rash and wound.  Neurological: Negative.  Negative for dizziness, extremity weakness, gait problem, headaches, light-headedness, numbness, seizures and speech difficulty.  Hematological: Negative.  Negative for adenopathy. Does not bruise/bleed easily.  Psychiatric/Behavioral: Negative.  Negative for confusion, decreased concentration, depression, sleep disturbance and suicidal ideas. The patient is not nervous/anxious.     VITALS:  Blood pressure 122/84, pulse 88, temperature 98.1 F (36.7 C), temperature source Oral, resp. rate 20, height 5\' 8"  (1.727 m), weight 211 lb 11.2 oz (96 kg), SpO2 99 %.  Wt Readings from Last 3 Encounters:  12/12/22 211 lb 11.2 oz (96 kg)  06/24/22 213 lb 6.4 oz (96.8 kg)  05/12/22 210 lb 11.2 oz (95.6 kg)    Body mass index is 32.19 kg/m.  Performance status (ECOG): 0 - Asymptomatic  PHYSICAL EXAM:  Physical Exam Vitals and nursing note reviewed. Exam conducted with a chaperone present.  Constitutional:      General: She is not in acute distress.    Appearance: Normal appearance. She is normal weight. She is not ill-appearing, toxic-appearing or diaphoretic.  HENT:     Head: Normocephalic and atraumatic.     Right Ear: Tympanic membrane, ear canal and external ear normal. There is no impacted cerumen.     Left Ear: Tympanic membrane, ear canal and external ear normal. There is no impacted cerumen.     Nose: Nose normal. No congestion or rhinorrhea.     Mouth/Throat:     Mouth: Mucous membranes are moist.     Pharynx: Oropharynx is clear. No oropharyngeal exudate or posterior oropharyngeal erythema.  Eyes:     General: No scleral icterus.       Right eye: No discharge.        Left eye: No discharge.      Extraocular Movements: Extraocular movements intact.     Conjunctiva/sclera: Conjunctivae normal.     Pupils: Pupils are equal, round, and reactive to light.  Neck:     Vascular: No carotid bruit.  Cardiovascular:     Rate and Rhythm: Normal rate and regular rhythm.     Pulses: Normal pulses.     Heart sounds: Normal heart sounds. No murmur heard.    No friction rub. No gallop.  Pulmonary:     Effort: Pulmonary effort is normal. No respiratory distress.     Breath sounds: Normal breath sounds. No stridor. No wheezing, rhonchi or rales.  Chest:     Chest wall: No tenderness.  Abdominal:     General: Bowel sounds are normal. There is no distension.     Palpations: Abdomen is soft. There is no hepatomegaly, splenomegaly or mass.     Tenderness: There is no abdominal tenderness. There is no right CVA tenderness, left CVA tenderness, guarding or rebound.     Hernia: No hernia is present.  Musculoskeletal:        General: No swelling, tenderness, deformity or signs of injury. Normal range of motion.     Cervical back: Normal range of motion and neck supple. No rigidity or tenderness.  Right lower leg: No edema.     Left lower leg: No edema.  Lymphadenopathy:     Cervical: No cervical adenopathy.     Right cervical: No superficial, deep or posterior cervical adenopathy.    Left cervical: No superficial, deep or posterior cervical adenopathy.     Upper Body:     Right upper body: No supraclavicular, axillary or pectoral adenopathy.     Left upper body: No supraclavicular, axillary or pectoral adenopathy.  Skin:    General: Skin is warm and dry.     Coloration: Skin is not jaundiced or pale.     Findings: No bruising, erythema, lesion or rash.  Neurological:     General: No focal deficit present.     Mental Status: She is alert and oriented to person, place, and time. Mental status is at baseline.     Cranial Nerves: No cranial nerve deficit.     Sensory: No sensory deficit.      Motor: No weakness.     Coordination: Coordination normal.     Gait: Gait normal.     Deep Tendon Reflexes: Reflexes normal.  Psychiatric:        Mood and Affect: Mood normal.        Behavior: Behavior normal.        Thought Content: Thought content normal.        Judgment: Judgment normal.     LABS:      Latest Ref Rng & Units 12/12/2022    2:03 PM 05/09/2022   12:00 AM 11/20/2021   12:00 AM  CBC  WBC 4.0 - 10.5 K/uL 11.7  10.3     9.4   Hemoglobin 12.0 - 15.0 g/dL 16.1  09.6     04.5   Hematocrit 36.0 - 46.0 % 41.4  39     40   Platelets 150 - 400 K/uL 308  282     273      This result is from an external source.      Latest Ref Rng & Units 12/12/2022    2:03 PM 05/09/2022   12:00 AM 11/20/2021   12:00 AM  CMP  Glucose 70 - 99 mg/dL 97     BUN 6 - 20 mg/dL 18  18     13    Creatinine 0.44 - 1.00 mg/dL 4.09  0.8     0.8   Sodium 135 - 145 mmol/L 137  140     141   Potassium 3.5 - 5.1 mmol/L 3.8  3.5     3.7   Chloride 98 - 111 mmol/L 103  107     107   CO2 22 - 32 mmol/L 24  25     27    Calcium 8.9 - 10.3 mg/dL 9.4  9.3     8.9   Total Protein 6.5 - 8.1 g/dL 8.0     Total Bilirubin 0.3 - 1.2 mg/dL 0.6     Alkaline Phos 38 - 126 U/L 95  101     97   AST 15 - 41 U/L 37  40     30   ALT 0 - 44 U/L 49  42     33      This result is from an external source.   Component Ref Range & Units 7 mo ago (05/09/22) 1 yr ago (11/20/21) 1 yr ago (05/20/21) 2 yr ago (11/15/20) 7 yr ago (03/12/15)  Inhibin B pg/mL <7.0 <7.0  CM <7.0 R, CM <7.0 R, CM < 10 CM      STUDIES:  No results found.    Allergies:  Allergies  Allergen Reactions   Other Nausea And Vomiting and Other (See Comments)    Real strong antibiotics   Codeine Nausea And Vomiting and Itching    Current Medications: Current Outpatient Medications  Medication Sig Dispense Refill   aspirin EC 81 MG tablet Take 1 tablet (81 mg total) by mouth daily. Swallow whole. 90 tablet 2   ezetimibe (ZETIA) 10 MG tablet  Take 10 mg by mouth daily. (Patient not taking: Reported on 12/12/2022)     metFORMIN (GLUCOPHAGE) 500 MG tablet Take 1 tablet (500 mg total) by mouth 2 (two) times daily. 60 tablet 0   metoprolol tartrate (LOPRESSOR) 25 MG tablet Take 1 tablet (25 mg total) by mouth 2 (two) times daily. 180 tablet 2   OVER THE COUNTER MEDICATION Take 1 Capful by mouth daily. Curamed supplement 1 capsule daily     Current Facility-Administered Medications  Medication Dose Route Frequency Provider Last Rate Last Admin   sodium chloride flush (NS) 0.9 % injection 10 mL  10 mL Intracatheter PRN Mosher, Kelli A, PA-C   10 mL at 12/12/22 1410   Facility-Administered Medications Ordered in Other Visits  Medication Dose Route Frequency Provider Last Rate Last Admin   sodium chloride flush (NS) 0.9 % injection 10 mL  10 mL Intracatheter PRN Ilda BassetParsons, Melissa A, NP   10 mL at 11/20/21 1052     ASSESSMENT & PLAN:  Assessment:   1. 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  She remains without evidence of recurrence.  2.  Mild lymphedema of the left upper extremity and axilla of unknown etiology. She states that this occurred after she received her COVID injection years ago. I gave her a booklet regarding lymphedema, the precautions to take as well as symptoms management.   Plan: She will have her annual mammogram today 12/12/2022 at 4:45. She continues to follow-up Dr. Bing MatterKrasowski. Her labs today are pending. I will see her back in 6 months with CBC, CMP, AMH, and inhibin B with a port flush. I will also plan a annual breast exam at that time. The patient understands the plans discussed today and is in agreement with them.  She knows to contact our office if she develops concerns prior to her next appointment.  I provided 13 minutes of face-to-face time during this this encounter and > 50% was spent counseling as documented under my assessment and plan.     Dellia Beckwithhristine H Huy Majid, MD [facility] [place] [address] [place] [id number] Dept: [phone] Dept Fax: [phone]    Rulon SeraI,Jasmine M Lassiter,acting as a scribe for Dellia Beckwithhristine H Kirsten Mckone, MD.,have documented all relevant documentation on the behalf of Dellia Beckwithhristine H Darrion Wyszynski, MD,as directed by  Dellia Beckwithhristine H Kelan Pritt, MD while in the presence of Dellia Beckwithhristine H Darden Flemister, MD.

## 2022-12-15 LAB — INHIBIN B: Inhibin B: <7 pg/mL

## 2022-12-16 LAB — ANTI MULLERIAN HORMONE: ANTI-MULLERIAN HORMONE (AMH): <0.015 ng/mL

## 2022-12-18 ENCOUNTER — Ambulatory Visit: Payer: Self-pay | Admitting: Oncology

## 2022-12-22 ENCOUNTER — Telehealth: Payer: Self-pay

## 2022-12-22 ENCOUNTER — Encounter: Payer: Self-pay | Admitting: Oncology

## 2022-12-22 NOTE — Telephone Encounter (Signed)
-----   Message from Belva Chimes, LPN sent at [id number] 11:50 AM EDT ----- Regarding: FW: call  ----- Message ----- From: Derwood Kaplan, MD Sent: 12/21/2022   3:29 PM EDT To: Vickki Hearing; Belva Chimes, LPN; # Subject: call                                           Tell her labs look good, incl. normal Inhibin B but I don't see AMH, does that mean it is still pending? Her one liver enzyme is still mildly elevated but stable

## 2022-12-22 NOTE — Telephone Encounter (Signed)
Patient notifed of lab results, AMH back in results review and looks the same as prior.

## 2023-01-08 ENCOUNTER — Encounter: Payer: Self-pay | Admitting: Hematology and Oncology

## 2023-02-11 DIAGNOSIS — K219 Gastro-esophageal reflux disease without esophagitis: Secondary | ICD-10-CM | POA: Insufficient documentation

## 2023-02-11 HISTORY — DX: Gastro-esophageal reflux disease without esophagitis: K21.9

## 2023-03-24 DIAGNOSIS — K760 Fatty (change of) liver, not elsewhere classified: Secondary | ICD-10-CM

## 2023-03-24 HISTORY — DX: Fatty (change of) liver, not elsewhere classified: K76.0

## 2023-06-26 ENCOUNTER — Other Ambulatory Visit: Payer: BLUE CROSS/BLUE SHIELD

## 2023-06-26 ENCOUNTER — Ambulatory Visit: Payer: BLUE CROSS/BLUE SHIELD | Admitting: Oncology

## 2023-07-13 ENCOUNTER — Encounter: Payer: Self-pay | Admitting: Hematology and Oncology

## 2023-07-13 ENCOUNTER — Inpatient Hospital Stay: Payer: BLUE CROSS/BLUE SHIELD | Attending: Hematology and Oncology

## 2023-07-13 ENCOUNTER — Ambulatory Visit: Payer: BLUE CROSS/BLUE SHIELD | Attending: Cardiology | Admitting: Cardiology

## 2023-07-13 ENCOUNTER — Encounter: Payer: Self-pay | Admitting: Cardiology

## 2023-07-13 ENCOUNTER — Inpatient Hospital Stay: Payer: BLUE CROSS/BLUE SHIELD | Admitting: Hematology and Oncology

## 2023-07-13 VITALS — BP 141/90 | HR 84 | Temp 98.0°F | Resp 18 | Ht 68.0 in | Wt 215.2 lb

## 2023-07-13 VITALS — BP 130/86 | HR 78 | Ht 68.0 in | Wt 215.0 lb

## 2023-07-13 DIAGNOSIS — R002 Palpitations: Secondary | ICD-10-CM

## 2023-07-13 DIAGNOSIS — C562 Malignant neoplasm of left ovary: Secondary | ICD-10-CM

## 2023-07-13 DIAGNOSIS — I4711 Inappropriate sinus tachycardia, so stated: Secondary | ICD-10-CM

## 2023-07-13 DIAGNOSIS — E876 Hypokalemia: Secondary | ICD-10-CM | POA: Diagnosis not present

## 2023-07-13 DIAGNOSIS — Z8543 Personal history of malignant neoplasm of ovary: Secondary | ICD-10-CM | POA: Diagnosis present

## 2023-07-13 DIAGNOSIS — E119 Type 2 diabetes mellitus without complications: Secondary | ICD-10-CM | POA: Diagnosis not present

## 2023-07-13 DIAGNOSIS — K760 Fatty (change of) liver, not elsewhere classified: Secondary | ICD-10-CM | POA: Insufficient documentation

## 2023-07-13 DIAGNOSIS — D72829 Elevated white blood cell count, unspecified: Secondary | ICD-10-CM | POA: Insufficient documentation

## 2023-07-13 DIAGNOSIS — Z9221 Personal history of antineoplastic chemotherapy: Secondary | ICD-10-CM | POA: Diagnosis not present

## 2023-07-13 DIAGNOSIS — E785 Hyperlipidemia, unspecified: Secondary | ICD-10-CM | POA: Diagnosis not present

## 2023-07-13 DIAGNOSIS — R7401 Elevation of levels of liver transaminase levels: Secondary | ICD-10-CM | POA: Insufficient documentation

## 2023-07-13 DIAGNOSIS — Z90721 Acquired absence of ovaries, unilateral: Secondary | ICD-10-CM | POA: Diagnosis not present

## 2023-07-13 LAB — CBC WITH DIFFERENTIAL (CANCER CENTER ONLY)
Abs Immature Granulocytes: 0.05 10*3/uL (ref 0.00–0.07)
Basophils Absolute: 0.1 10*3/uL (ref 0.0–0.1)
Basophils Relative: 1 %
Eosinophils Absolute: 0.2 10*3/uL (ref 0.0–0.5)
Eosinophils Relative: 2 %
HCT: 40.1 % (ref 36.0–46.0)
Hemoglobin: 12.4 g/dL (ref 12.0–15.0)
Immature Granulocytes: 0 %
Lymphocytes Relative: 36 %
Lymphs Abs: 4.2 10*3/uL — ABNORMAL HIGH (ref 0.7–4.0)
MCH: 27.4 pg (ref 26.0–34.0)
MCHC: 30.9 g/dL (ref 30.0–36.0)
MCV: 88.7 fL (ref 80.0–100.0)
Monocytes Absolute: 0.8 10*3/uL (ref 0.1–1.0)
Monocytes Relative: 7 %
Neutro Abs: 6.3 10*3/uL (ref 1.7–7.7)
Neutrophils Relative %: 54 %
Platelet Count: 272 10*3/uL (ref 150–400)
RBC: 4.52 MIL/uL (ref 3.87–5.11)
RDW: 14.4 % (ref 11.5–15.5)
WBC Count: 11.6 10*3/uL — ABNORMAL HIGH (ref 4.0–10.5)
nRBC: 0 % (ref 0.0–0.2)

## 2023-07-13 LAB — CMP (CANCER CENTER ONLY)
ALT: 66 U/L — ABNORMAL HIGH (ref 0–44)
AST: 53 U/L — ABNORMAL HIGH (ref 15–41)
Albumin: 3.9 g/dL (ref 3.5–5.0)
Alkaline Phosphatase: 100 U/L (ref 38–126)
Anion gap: 11 (ref 5–15)
BUN: 16 mg/dL (ref 6–20)
CO2: 23 mmol/L (ref 22–32)
Calcium: 8.8 mg/dL — ABNORMAL LOW (ref 8.9–10.3)
Chloride: 103 mmol/L (ref 98–111)
Creatinine: 0.95 mg/dL (ref 0.44–1.00)
GFR, Estimated: >60 mL/min (ref >60)
Glucose, Bld: 149 mg/dL — ABNORMAL HIGH (ref 70–99)
Potassium: 3.4 mmol/L — ABNORMAL LOW (ref 3.5–5.1)
Sodium: 137 mmol/L (ref 135–145)
Total Bilirubin: 0.6 mg/dL (ref 0.3–1.2)
Total Protein: 7.7 g/dL (ref 6.5–8.1)

## 2023-07-13 MED ORDER — NEXLETOL 180 MG PO TABS: 1.0000 | ORAL_TABLET | Freq: Every day | ORAL | Status: DC

## 2023-07-13 MED ORDER — SODIUM CHLORIDE 0.9% FLUSH
10.0000 mL | INTRAVENOUS | Status: DC | PRN
  Administered 2023-07-13: 10 mL via INTRAVENOUS

## 2023-07-13 MED ORDER — HEPARIN SOD (PORK) LOCK FLUSH 100 UNIT/ML IV SOLN
500.0000 [IU] | Freq: Once | INTRAVENOUS | Status: AC
  Administered 2023-07-13: 500 [IU] via INTRAVENOUS

## 2023-07-13 NOTE — Progress Notes (Signed)
Cardiology Office Note:    Date:  07/13/2023   ID:  Lorel Monaco, DOB 10-27-1965, MRN [id number]  PCP:  Bartholome Bill, NP-C  Cardiologist:  Gypsy Balsam, MD    Referring MD: Simone Curia, MD   Chief Complaint  Patient presents with   Follow-up    History of Present Illness:    Nicole Beck is a 57 y.o. female past medical history significant for inappropriate sinus tachycardia, essential hypertension, dyslipidemia, diabetes.  She comes today to months for follow-up.  Overall doing well.  She denies have any chest pain tightness squeezing pressure burning chest very rare palpitations that do not bother her a lot.  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 Swallow whole.   Cholecalciferol (VITAMIN D-3 PO) Take 2 tablets by mouth daily. Unsure of unit dose   Cyanocobalamin (B-12 PO) Take 1 tablet by mouth daily. Unsure of mcg dose   ezetimibe (ZETIA) 10 MG tablet Take 10 mg by mouth daily.   magnesium oxide (MAG-OX) 400 (240 Mg) MG tablet Take 400 mg by mouth every other day.   metFORMIN (GLUCOPHAGE) 850 MG tablet Take 850 mg by mouth daily with breakfast.   metoprolol tartrate (LOPRESSOR) 25 MG tablet Take 25 mg by mouth 2 (two) times daily.   OVER THE COUNTER MEDICATION Take 1 Capful by mouth daily. Curamed  supplement 1 capsule daily (Patient not taking: Reported on 07/13/2023)   pregabalin (LYRICA) 50 MG capsule Take 50 mg by mouth 2 (two) times daily.   [DISCONTINUED] metFORMIN (GLUCOPHAGE) 500 MG tablet Take 1 tablet (500 mg total) by mouth 2 (two) times daily.   [DISCONTINUED] metoprolol tartrate (LOPRESSOR) 25 MG tablet Take 1 tablet (25 mg total) by mouth 2 (two) times daily.     Allergies:   Codeine and Other   Social History   Socioeconomic History   Marital status: Married    Spouse name: Not on file   Number of children: Not on file   Years of education: Not on file   Highest education level: Not on file  Occupational History   Not on file  Tobacco Use   Smoking status: Never   Smokeless tobacco: Never  Substance and Sexual Activity   Alcohol use: Never   Drug use: Never   Sexual activity: Yes  Other Topics Concern   Not on file  Social History Narrative   ** Merged History Encounter **       Social Determinants of Health   Financial Resource Strain: Low Risk  (12/30/2022)   Received from [facility], [facility]   Overall Financial Resource Strain (CARDIA)    Difficulty of Paying Living Expenses: Not hard at all  Food Insecurity: No Food Insecurity (12/30/2022)   [facility]   Hunger Vital Sign    Worried About Running Out of Food in the Last Year: Never true    Ran Out of Food in the Last Year: Never true  Transportation Needs: No Transportation Needs (12/30/2022)   Received from [facility], [facility]   PRAPARE - Transportation    Lack of Transportation (Medical): No    Lack of Transportation (Non-Medical): No  Physical Activity: Not on file  Stress: Not on file  Social Connections: Unknown (12/30/2022)   Received from [facility], [facility]   Social Network    Social Network: Not on file     Family History: The patient's family history includes CAD in her father, maternal grandfather, and sister; Cancer in her  maternal aunt, mother, and sister; Diabetes in her maternal grandmother, mother, and sister. ROS:   Please see the history of present illness.    All 14 point review of systems negative except as described per history of present illness  EKGs/Labs/Other Studies Reviewed:    EKG Interpretation Date/Time:  Monday July 13 2023 14:05:55 EDT Ventricular Rate:  78 PR Interval:  164 QRS Duration:  74 QT Interval:  388 QTC Calculation: 442 R Axis:   -19  Text Interpretation: Normal sinus rhythm Normal ECG No previous ECGs available Confirmed by Gypsy Balsam [phone]) on 07/13/2023 2:14:02 PM    Recent Labs: 12/12/2022: ALT 49; BUN 18; Creatinine 0.72; Potassium 3.8; Sodium 137 07/13/2023: Hemoglobin 12.4; Platelet Count 272  Recent Lipid Panel    Component Value Date/Time   CHOL 166 07/18/2021 1023   TRIG 179 (H) 07/18/2021 1023   HDL 51 07/18/2021 1023   CHOLHDL 3.3 07/18/2021 1023   CHOLHDL 5.0 05/20/2021 1050   VLDL 40 05/20/2021 1050   LDLCALC 85 07/18/2021 1023    Physical Exam:    VS:  BP 130/86 (BP Location: Left Arm, Patient Position: Sitting)   Pulse 78   Ht 5\' 8"  (1.727 m)   Wt 215 lb (97.5 kg)   SpO2 92%   BMI 32.69 kg/m     Wt Readings from Last 3 Encounters:  07/13/23 215 lb (97.5 kg)  07/13/23 215 lb 3.2 oz (97.6 kg)  12/12/22 211 lb 11.2 oz (96 kg)     GEN:  Well nourished, well developed in no acute distress HEENT: Normal NECK: No JVD; No carotid bruits LYMPHATICS: No lymphadenopathy CARDIAC: RRR, no murmurs, no rubs, no gallops RESPIRATORY:  Clear to auscultation without rales, wheezing or rhonchi  ABDOMEN: Soft, non-tender, non-distended MUSCULOSKELETAL:  No edema; No deformity  SKIN: Warm and dry LOWER EXTREMITIES: no swelling NEUROLOGIC:  Alert and oriented x 3 PSYCHIATRIC:  Normal affect   ASSESSMENT:    1. Inappropriate sinus tachycardia (HCC)   2. Palpitations   3. Diabetes mellitus without complication (HCC)   4. Dyslipidemia     PLAN:    In order of problems listed above:  Inappropriate sinus tachycardia well-controlled with small dose of beta-blocker. Palpitations denies having any. Diabetes fairly controlled.  She is taking metformin. Dyslipidemia: Her LDL is still not well-controlled I did review blood work done by primary care physician with LDL at 123.  She takes Zetia 10 mg daily will add Nexletol to her medical therapy fasting lipid profile need to be done in 6 weeks.   Medication Adjustments/Labs and Tests Ordered: Current medicines are reviewed at length with the patient today.  Concerns regarding medicines are outlined above.  Orders Placed This Encounter  Procedures   EKG 12-Lead   Medication changes: No orders of the defined types were placed in this encounter.   Signed, Georgeanna Lea, MD, [facility] 07/13/2023 2:41 PM    [facility]

## 2023-07-13 NOTE — Progress Notes (Signed)
[facility]  [address] 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 She was treated with a left salpingo-oophorectomy, as the lesion appeared cystic. Pathology revealed a 9 cm granulosa cell tumor with rupture of the capsule. She received adjuvant chemotherapy with 4 cycles of bleomycin, etoposide and cisplatin. She did not receive all of the bleomycin doses because of respiratory problems, which included infection and pulmonary emboli. She was treated with Coumadin for 6 months for the pulmonary emboli. She had multiple toxicities from chemotherapy and required an admission for febrile neutropenia. Due to her history of ovarian cancer, she underwent testing for hereditary cancer syndromes with the My[employer] myRisk Hereditary Cancer Panel test. This did not reveal any clinically significant mutation or any variants of uncertain significance. She has a right ovarian cyst, which is being followed, as the patient decided to delay right salpingo-oophorectomy and hysterectomy. She was found to have a low B12 level and was initially on monthly injections, then transitioned to oral. She has also had mild chronic hypokalemia, but does not like to take a potassium supplement. She has also had mild elevation of the liver transaminases, which have fluctuated up and down. CT abdomen and pelvis in January 2019 revealed a stable right ovarian cyst, normal appearing uterus, and no evidence of recurrent/metastatic disease. Screening mammography from February 2022 revealed a possible distortion in the left breast. Therefore  diagnostic unilateral left mammogram and ultrasound were pursued in March which confirmed no evidence of malignancy.  Colonoscopy in May 2022 with removal of a tubular adenoma.  Bilateral screening mammogram in March 2024 did not reveal any evidence of malignancy.  Oncology History  Granulosa cell carcinoma of ovary (HCC)  01/10/2013 Cancer Staging   Staging form: Ovary, AJCC 7th Edition - Clinical stage from 01/10/2013: FIGO Stage I (T1, N0, M0) - Signed by Dellia Beckwith, MD on 05/26/2021 Staged by: Managing physician Diagnostic confirmation: Positive histology Specimen type: Excision Histopathologic type: Granulosa cell-theca cell tumor Stage prefix: Initial diagnosis Laterality: Left Tumor size (mm): 90 Lymph-vascular invasion (LVI): LVI not present (absent)/not identified Residual tumor (R): R0 - None Gross residual tumor after primary cyto-reductive surgery: Absent Prognostic indicators: Stage IC, treated with adjuvant BEP chemo x 4 Stage used in treatment planning: Yes National guidelines used in treatment planning: Yes Type of national guideline used in treatment planning: NCCN   01/17/2013 Initial Diagnosis   Granulosa cell carcinoma of ovary (HCC)       INTERVAL HISTORY:  Nicole Beck is here today for repeat clinical assessment.  She denies abdominal pain.  She denies vaginal discharge or bleeding she reports neck pain due to degenerative disease.  Dr. Samuel Bouche recommended pregabalin, but she has not started this.  She has been referred to orthopedic surgery.  She states since chemo she has had generalized bone pain, which is associated with cold temperatures, so this is bothering her now.  She also had severe fatigue this summer, which she attributed to the heat.  She denies fevers or chills. She denies pain. Her appetite is good. Her weight has increased 4 pounds  over last 6 months .  She was felt to have possible supraclavicular adenopathy in July.  CT neck soft tissue and CT  chest did not reveal any lymphadenopathy.  Incidental finding of fatty liver.  She continues to follow with Dr. Cephus Shelling for tachycardia and sees him today.  REVIEW OF SYSTEMS:  Review of Systems  Constitutional:  Negative for appetite change, chills, fatigue, fever and unexpected weight change.  HENT:   Negative for lump/mass, mouth sores, nosebleeds and sore throat.   Respiratory:  Negative for cough and shortness of breath.   Cardiovascular:  Negative for chest pain and leg swelling.  Gastrointestinal:  Negative for abdominal pain, blood in stool, constipation, diarrhea, nausea and vomiting.  Endocrine: Negative for hot flashes.  Genitourinary:  Negative for difficulty urinating, dysuria, frequency, hematuria and vaginal bleeding.   Musculoskeletal:  Positive for arthralgias, myalgias and neck pain. Negative for back pain.  Skin:  Negative for rash.  Neurological:  Negative for dizziness and headaches.  Hematological:  Negative for adenopathy. Does not bruise/bleed easily.  Psychiatric/Behavioral:  Negative for depression and sleep disturbance. The patient is not nervous/anxious.      VITALS:  Blood pressure (!) 141/90, pulse 84, temperature 98 F (36.7 C), temperature source Oral, resp. rate 18, height 5\' 8"  (1.727 m), weight 215 lb 3.2 oz (97.6 kg), SpO2 98%.  Wt Readings from Last 3 Encounters:  07/13/23 215 lb (97.5 kg)  07/13/23 215 lb 3.2 oz (97.6 kg)  12/12/22 211 lb 11.2 oz (96 kg)    Body mass index is 32.72 kg/m.  Performance status (ECOG): 1 - Symptomatic but completely ambulatory  PHYSICAL EXAM:  Physical Exam  LABS:      Latest Ref Rng & Units 07/13/2023   11:07 AM 12/12/2022    2:03 PM 05/09/2022   12:00 AM  CBC  WBC 4.0 - 10.5 K/uL 11.6  11.7  10.3      Hemoglobin 12.0 - 15.0 g/dL 24.4  01.0  27.2      Hematocrit 36.0 - 46.0 % 40.1  41.4  39      Platelets 150 - 400 K/uL 272  308  282         This result is from an external source.      Latest Ref  Rng & Units 07/13/2023   11:07 AM 12/12/2022    2:03 PM 05/09/2022   12:00 AM  CMP  Glucose 70 - 99 mg/dL 536  97    BUN 6 - 20 mg/dL 16  18  18       Creatinine 0.44 - 1.00 mg/dL 6.44  0.34  0.8      Sodium 135 - 145 mmol/L 137  137  140      Potassium 3.5 - 5.1 mmol/L 3.4  3.8  3.5      Chloride 98 - 111 mmol/L 103  103  107      CO2 22 - 32 mmol/L 23  24  25       Calcium 8.9 - 10.3 mg/dL 8.8  9.4  9.3      Total Protein 6.5 - 8.1 g/dL 7.7  8.0    Total Bilirubin 0.3 - 1.2 mg/dL 0.6  0.6    Alkaline Phos 38 - 126 U/L 100  95  101      AST 15 - 41 U/L 53  37  40      ALT 0 - 44 U/L 66  49  42  This result is from an external source.     No results found for: "CEA1", "CEA" / No results found for: "CEA1", "CEA" No results found for: "PSA1" No results found for: "ZOX096" No results found for: "CAN125"  No results found for: "TOTALPROTELP", "ALBUMINELP", "A1GS", "A2GS", "BETS", "BETA2SER", "GAMS", "MSPIKE", "SPEI" No results found for: "TIBC", "FERRITIN", "IRONPCTSAT" No results found for: "LDH"  STUDIES:  No results found.    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   Malignant neoplasm of left ovary (HCC) 12/31/2012   Palpitations 07/02/2015   Postmenopausal bleeding 09/15/2016   Shortness of breath 07/02/2015   Tachycardia     Past Surgical History:  Procedure Laterality Date   CHOLECYSTECTOMY     KNEE SURGERY Left    left salpingoophorectomy Left    TMJ ARTHROPLASTY Right    TUBAL LIGATION      Family History  Problem Relation Age of Onset   Diabetes Mother    Cancer Mother    CAD Father    Diabetes Sister    CAD Sister    Cancer Sister    Diabetes Maternal Grandmother    CAD Maternal Grandfather    Cancer Maternal Aunt     Social History:   reports that she has never smoked. She has never used smokeless tobacco. She reports that she does not drink alcohol and does not use drugs.The patient is alone today.  Allergies:  Allergies  Allergen Reactions   Codeine Itching, Nausea And Vomiting and Other (See Comments)   Other Nausea And Vomiting and Other (See Comments)    Real strong antibiotics    Current Medications: Current Outpatient Medications  Medication Sig Dispense Refill   Cholecalciferol (VITAMIN D-3 PO) Take 2 tablets by mouth daily. Unsure of unit dose     Cyanocobalamin (B-12 PO) Take 1 tablet by mouth daily. Unsure of mcg dose     magnesium oxide (MAG-OX) 400 (240 Mg) MG tablet Take 400 mg by mouth every other day.     metFORMIN (GLUCOPHAGE) 850 MG tablet Take 850 mg by mouth daily with breakfast.     metoprolol tartrate (LOPRESSOR) 25 MG tablet Take 25 mg by mouth 2 (two) times daily.     pregabalin (LYRICA) 50 MG capsule Take 50 mg by mouth 2 (two) times daily.     aspirin EC 81 MG tablet Take 1 tablet (81 mg total) by mouth daily. Swallow whole. 90 tablet 2   Bempedoic Acid (NEXLETOL) 180 MG TABS Take 1 tablet (180 mg total) by mouth daily.     ezetimibe (ZETIA) 10 MG tablet Take 10 mg by mouth daily.     OVER THE COUNTER MEDICATION Take 1 Capful by mouth daily. Curamed supplement 1 capsule daily (Patient not taking: Reported on 07/13/2023)     Current Facility-Administered Medications  Medication Dose Route Frequency Provider Last Rate Last Admin   sodium chloride flush (NS) 0.9 % injection 10 mL  10 mL Intravenous PRN Ryllie Nieland A, PA-C   10 mL at 07/13/23 1135   Facility-Administered Medications Ordered in Other Visits  Medication Dose Route Frequency Provider Last Rate Last Admin   sodium chloride flush (NS) 0.9 % injection 10 mL  10 mL Intracatheter PRN Ilda Basset A, NP   10 mL at  11/20/21 1052     ASSESSMENT & PLAN:   Assessment & Plan: Nicole Beck is a 57 y.o. 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  She remains without evidence of recurrence.  Tumor markers are pending from today.   Persistent elevation of the liver enzymes, now with findings of fatty liver on recent CT imaging. Mild leukocytosis with normal differential of uncertain etiology.  This is stable.  Continue to monitor. Mild hypokalemia and hypocalcemia. She does not take supplements. Will have her increase in her diet.  We will plan to see her back in 6 months with a CBC, comprehensive metabolic panel, AMH and inhibin B.  The patient understands the plans discussed today and is in agreement with them.  She knows to contact our office if she develops concerns prior to her next appointment.     I provided 30 minutes of face-to-face time during this encounter and > 50% was spent counseling as documented under my assessment and plan.    Adah Perl, PA-C  [facility] [address] [place] [address] Dept: [phone] Dept Fax: [phone]   Orders Placed This Encounter  Procedures   Anti mullerian hormone

## 2023-07-13 NOTE — Addendum Note (Signed)
Addended by: Baldo Ash D on: 07/13/2023 02:45 PM   Modules accepted: Orders

## 2023-07-13 NOTE — Patient Instructions (Addendum)
Medication Instructions:  Your physician recommends that you continue on your current medications as directed. Please refer to the Current Medication list given to you today.  *If you need a refill on your cardiac medications before your next appointment, please call your pharmacy*   Lab Work: Your physician recommends that you return for lab work in: 6 weeks You need to have labs done when you are fasting.  You can come Monday through Friday 8:30 am to 12:00 pm and 1:15 to 4:30. You do not need to make an appointment as the order has already been placed. The labs you are going to have done are AST, ALT Lipids.    Testing/Procedures: None Ordered   Follow-Up: At [facility], you and your health needs are our priority.  As part of our continuing mission to provide you with exceptional heart care, we have created designated Provider Care Teams.  These Care Teams include your primary Cardiologist (physician) and Advanced Practice Providers (APPs -  Physician Assistants and Nurse Practitioners) who all work together to provide you with the care you need, when you need it.  We recommend signing up for the patient portal called "MyChart".  Sign up information is provided on this After Visit Summary.  MyChart is used to connect with patients for Virtual Visits (Telemedicine).  Patients are able to view lab/test results, encounter notes, upcoming appointments, etc.  Non-urgent messages can be sent to your provider as well.   To learn more about what you can do with MyChart, go to [email or web address].    Your next appointment:   12 month(s)  The format for your next appointment:   In Person  Provider:   Gypsy Balsam, MD    Other Instructions NA

## 2023-07-13 NOTE — Addendum Note (Signed)
Addended by: Heywood Bene on: 07/13/2023 02:50 PM   Modules accepted: Orders

## 2023-07-14 ENCOUNTER — Encounter: Payer: Self-pay | Admitting: Hematology and Oncology

## 2023-07-15 ENCOUNTER — Telehealth: Payer: Self-pay | Admitting: Hematology and Oncology

## 2023-07-15 ENCOUNTER — Telehealth: Payer: Self-pay

## 2023-07-15 LAB — INHIBIN B: Inhibin B: <7 pg/mL

## 2023-07-15 NOTE — Telephone Encounter (Signed)
Pt called to make [place] aware that she tested positive for COVID, as she started felling bad Monday evening. She has low grade fever, sore throat, headache, and sinus pressure. She voiced concern for you as she saw you in clinic on Monday. She would like to know if you could send in Paxlovid to [facility], [place]?

## 2023-07-15 NOTE — Telephone Encounter (Signed)
Patient has been scheduled. Aware of appt date and time.   Scheduling Message Entered by Belva Crome A on 07/13/2023 at  3:22 PM Priority: Routine <No visit type provided>  Department: [facility]  Provider:  Scheduling Notes:  Labs and f/u in 6 months

## 2023-07-22 LAB — ANTI MULLERIAN HORMONE: ANTI-MULLERIAN HORMONE (AMH): <0.015 ng/mL

## 2023-07-23 ENCOUNTER — Telehealth: Payer: Self-pay

## 2023-07-23 NOTE — Telephone Encounter (Signed)
Patient notified and voiced understanding.

## 2023-07-23 NOTE — Telephone Encounter (Signed)
-----   Message from Adah Perl sent at 07/22/2023 12:01 PM EDT ----- Please let her know her tumor markers were normal. WBC a little high, probably infection. Potassium mildly low, increase in diet. Thanks

## 2023-07-25 ENCOUNTER — Other Ambulatory Visit: Payer: Self-pay | Admitting: Cardiology

## 2023-10-20 ENCOUNTER — Other Ambulatory Visit: Payer: Self-pay | Admitting: Cardiology

## 2023-10-20 DIAGNOSIS — C562 Malignant neoplasm of left ovary: Secondary | ICD-10-CM

## 2023-10-26 DIAGNOSIS — R7689 Other specified abnormal immunological findings in serum: Secondary | ICD-10-CM

## 2023-10-26 DIAGNOSIS — R768 Other specified abnormal immunological findings in serum: Secondary | ICD-10-CM | POA: Insufficient documentation

## 2023-10-26 HISTORY — DX: Other specified abnormal immunological findings in serum: R76.89

## 2023-10-27 ENCOUNTER — Telehealth: Payer: Self-pay

## 2023-10-27 NOTE — Telephone Encounter (Signed)
Patient has called wanting Dr. Gilman Buttner to take a look at her labs she had done in [place].. 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 Labs done at [facility] and would be in Care Everywhere.

## 2023-10-28 ENCOUNTER — Encounter: Payer: Self-pay | Admitting: Hematology and Oncology

## 2023-10-30 DIAGNOSIS — M47816 Spondylosis without myelopathy or radiculopathy, lumbar region: Secondary | ICD-10-CM

## 2023-10-30 HISTORY — DX: Spondylosis without myelopathy or radiculopathy, lumbar region: M47.816

## 2023-11-06 NOTE — Telephone Encounter (Signed)
 Patient has an appointment to see a Rheumatologist soon. Notified of message

## 2023-11-27 DIAGNOSIS — M722 Plantar fascial fibromatosis: Secondary | ICD-10-CM

## 2023-11-27 HISTORY — DX: Plantar fascial fibromatosis: M72.2

## 2023-12-08 ENCOUNTER — Telehealth: Payer: Self-pay | Admitting: Cardiology

## 2023-12-08 ENCOUNTER — Encounter: Payer: Self-pay | Admitting: Hematology and Oncology

## 2023-12-08 NOTE — Telephone Encounter (Signed)
 Called and spoke with sister Milus Height. She stated that she had already rescheduled the appt. She stated that she did not have any other needs or questions.

## 2023-12-08 NOTE — Telephone Encounter (Signed)
 Pt's sister called in stating pt HR has been up and down. She did not have any readings from pt and she did not say any other symptoms she could be having.

## 2024-01-13 ENCOUNTER — Ambulatory Visit: Payer: BLUE CROSS/BLUE SHIELD | Admitting: Hematology and Oncology

## 2024-01-13 ENCOUNTER — Other Ambulatory Visit: Payer: BLUE CROSS/BLUE SHIELD

## 2024-01-20 NOTE — Progress Notes (Signed)
 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 She was treated with a left salpingo-oophorectomy, as the lesion appeared cystic. Pathology revealed a 9 cm granulosa cell tumor with rupture of the capsule. She received adjuvant chemotherapy with 4 cycles of bleomycin, etoposide and cisplatin. She did not receive all of the bleomycin doses because of respiratory problems, which included infection and pulmonary emboli. She was treated with Coumadin for 6 months for the pulmonary emboli. She had multiple toxicities from chemotherapy and required an admission for febrile neutropenia. Due to her history of ovarian cancer, she underwent testing for hereditary cancer syndromes with the Myriad myRisk Hereditary Cancer Panel test. This did not reveal any clinically significant mutation or any variants of uncertain significance. She has a right ovarian cyst, which is being followed, as the patient decided to delay right salpingo-oophorectomy and hysterectomy. She was found to have a low B12 level and was initially on monthly injections, then transitioned to oral. She has also had mild chronic hypokalemia, but does not like to take a potassium supplement. She has also had mild elevation of the liver transaminases, which have fluctuated up and down. CT abdomen and pelvis in January 2019 revealed a stable right ovarian cyst, normal appearing uterus, and no evidence of recurrent/metastatic disease. Screening mammography from February 2022 revealed a possible distortion in the left breast. Therefore diagnostic  unilateral left mammogram and ultrasound were pursued in March which confirmed no evidence of malignancy.  Colonoscopy in May 2022 with removal of a tubular adenoma.  Bilateral screening mammogram in March 2024 did not reveal any evidence of malignancy.  Oncology History  Granulosa cell carcinoma of ovary (HCC)  01/10/2013 Cancer Staging   Staging form: Ovary, AJCC 7th Edition - Clinical stage from 01/10/2013: FIGO Stage I (T1, N0, M0) - Signed by Nolia Baumgartner, MD on 05/26/2021 Staged by: Managing physician Diagnostic confirmation: Positive histology Specimen type: Excision Histopathologic type: Granulosa cell-theca cell tumor Stage prefix: Initial diagnosis Laterality: Left Tumor size (mm): 90 Lymph-vascular invasion (LVI): LVI not present (absent)/not identified Residual tumor (R): R0 - None Gross residual tumor after primary cyto-reductive surgery: Absent Prognostic indicators: Stage IC, treated with adjuvant BEP chemo x 4 Stage used in treatment planning: Yes National guidelines used in treatment planning: Yes Type of national guideline used in treatment planning: NCCN   01/17/2013 Initial Diagnosis   Granulosa cell carcinoma of ovary (HCC)     INTERVAL HISTORY:  Nicole Beck is here today for repeat clinical assessment for her 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 Patient states that she feels well but complains of joint/muscle pain, worsening neuropathy, imbalance, weakening strength, and plantar fascitis. She continues Gabapentin  100 mg BID but states that her pain continues to worsen. I will increase this to 300 mg once at bedtime before titrating up to BID and the neurologist can titrate from there or recommend an alternative. She has an appointment with her neurologist/RA specialist on 03/10/2024. Her PCP thinks her symptoms can be caused by autoimmune hepatitis or other autoimmune disease. I do  think it seems to be some form of rheumatologic disorder.  She had a screening bilateral mammogram done on 01/21/2024 which was clear. She has a WBC of 9.2, hemoglobin of 12.2, and platelet count of 237,000. Her CMP is normal other than a non-fasting glucose of 162. Her inhibin B and anti-mullerian hormone (AMH) levels are pending today. I will see her back in 6 months with CBC, CMP, inhibin B, AMH, and port flush. She denies fever, chills, night sweats, or other signs of infection. She denies cardiorespiratory and gastrointestinal issues. Her appetite is good and her weight has decreased 13 pounds over last 6 months . This patient is accompanied in the office by her sister.  REVIEW OF SYSTEMS:  Review of Systems  Constitutional:  Negative for appetite change, chills, fatigue, fever and unexpected weight change.  HENT:  Negative.  Negative for lump/mass, mouth sores, nosebleeds and sore throat.   Eyes:  Positive for eye problems (Sjogrens syndrome).  Respiratory: Negative.  Negative for chest tightness, cough, hemoptysis, shortness of breath and wheezing.   Cardiovascular: Negative.  Negative for chest pain, leg swelling and palpitations.  Gastrointestinal: Negative.  Negative for abdominal distention, abdominal pain, blood in stool, constipation, diarrhea, nausea and vomiting.  Endocrine: Negative.  Negative for hot flashes.  Genitourinary: Negative.  Negative for difficulty urinating, dysuria, frequency, hematuria and vaginal bleeding.   Musculoskeletal:  Positive for arthralgias (chronic), gait problem (imbalance), myalgias (chronic) and neck pain (chronic). Negative for back pain and flank pain.       Plantar fascitis   Skin: Negative.  Negative for rash.  Neurological:  Positive for extremity weakness, gait problem (imbalance) and numbness (neuropathy). Negative for dizziness, headaches, light-headedness, seizures and speech difficulty.  Hematological: Negative.  Negative for adenopathy. Does not bruise/bleed easily.  Psychiatric/Behavioral: Negative.   Negative for depression and sleep disturbance. The patient is not nervous/anxious.        Decreased memory    VITALS:  Blood pressure 132/86, pulse 87, temperature 97.9 F (36.6 C), temperature source Oral, resp. rate 18, height 5\' 8"  (1.727 m), weight 202 lb (91.6 kg), SpO2 98%.  Wt Readings from Last 3 Encounters:  01/21/24 202 lb 6.4 oz (91.8 kg)  01/21/24 202 lb (91.6 kg)  07/13/23 215 lb (97.5 kg)    Body mass index is 30.71 kg/m.  Performance status (ECOG): 1 - Symptomatic but completely ambulatory  PHYSICAL EXAM:  Physical Exam Vitals and nursing note reviewed. Exam conducted with a chaperone present.  Constitutional:      General: She is not in acute distress.    Appearance: Normal appearance. She is normal weight. She is not ill-appearing, toxic-appearing or diaphoretic.  HENT:     Head: Normocephalic and atraumatic.     Right Ear: Tympanic membrane, ear canal and external ear normal. There is no impacted cerumen.     Left Ear: Tympanic membrane, ear canal and external ear normal. There is no impacted cerumen.     Nose: Nose normal. No congestion or rhinorrhea.     Mouth/Throat:     Mouth: Mucous membranes are moist.     Pharynx: Oropharynx is clear. No oropharyngeal exudate or posterior oropharyngeal erythema.  Eyes:     General: No scleral icterus.       Right eye: No discharge.        Left eye: No discharge.     Extraocular Movements: Extraocular movements intact.     Conjunctiva/sclera: Conjunctivae normal.     Pupils: Pupils are  equal, round, and reactive to light.  Neck:     Vascular: No carotid bruit.  Cardiovascular:     Rate and Rhythm: Normal rate and regular rhythm.     Pulses: Normal pulses.     Heart sounds: Normal heart sounds. No murmur heard.    No friction rub. No gallop.  Pulmonary:     Effort: Pulmonary effort is normal. No respiratory distress.     Breath sounds: Normal breath sounds. No stridor. No wheezing, rhonchi or rales.  Chest:      Chest wall: No tenderness.  Abdominal:     General: Bowel sounds are normal. There is no distension.     Palpations: Abdomen is soft. There is no hepatomegaly, splenomegaly or mass.     Tenderness: There is no abdominal tenderness. There is no right CVA tenderness, left CVA tenderness, guarding or rebound.     Hernia: No hernia is present.  Musculoskeletal:        General: No swelling, tenderness, deformity or signs of injury. Normal range of motion.     Cervical back: Normal range of motion and neck supple. No rigidity or tenderness.     Right lower leg: No edema.     Left lower leg: No edema.  Lymphadenopathy:     Cervical: No cervical adenopathy.     Right cervical: No superficial, deep or posterior cervical adenopathy.    Left cervical: No superficial, deep or posterior cervical adenopathy.     Upper Body:     Right upper body: No supraclavicular, axillary or pectoral adenopathy.     Left upper body: No supraclavicular, axillary or pectoral adenopathy.  Skin:    General: Skin is warm and dry.     Coloration: Skin is not jaundiced or pale.     Findings: No bruising, erythema, lesion or rash.  Neurological:     General: No focal deficit present.     Mental Status: She is alert and oriented to person, place, and time. Mental status is at baseline.     Cranial Nerves: No cranial nerve deficit.     Sensory: No sensory deficit.     Motor: No weakness.     Coordination: Coordination normal.     Gait: Gait normal.     Deep Tendon Reflexes: Reflexes normal.  Psychiatric:        Mood and Affect: Mood normal.        Behavior: Behavior normal.        Thought Content: Thought content normal.        Judgment: Judgment normal.     LABS:      Latest Ref Rng & Units 01/21/2024   10:10 AM 07/13/2023   11:07 AM 12/12/2022    2:03 PM  CBC  WBC 4.0 - 10.5 K/uL 9.2  11.6  11.7   Hemoglobin 12.0 - 15.0 g/dL 16.1  09.6  04.5   Hematocrit 36.0 - 46.0 % 38.7  40.1  41.4   Platelets 150 -  400 K/uL 237  272  308       Latest Ref Rng & Units 01/21/2024   10:10 AM 07/13/2023   11:07 AM 12/12/2022    2:03 PM  CMP  Glucose 70 - 99 mg/dL 409  811  97   BUN 6 - 20 mg/dL 10  16  18    Creatinine 0.44 - 1.00 mg/dL 9.14  7.82  9.56   Sodium 135 - 145 mmol/L 141  137  137  Potassium 3.5 - 5.1 mmol/L 3.5  3.4  3.8   Chloride 98 - 111 mmol/L 103  103  103   CO2 22 - 32 mmol/L 25  23  24    Calcium  8.9 - 10.3 mg/dL 9.1  8.8  9.4   Total Protein 6.5 - 8.1 g/dL 7.1  7.7  8.0   Total Bilirubin 0.0 - 1.2 mg/dL 0.3  0.6  0.6   Alkaline Phos 38 - 126 U/L 124  100  95   AST 15 - 41 U/L 26  53  37   ALT 0 - 44 U/L 24  66  49    No results found for: "CEA1", "CEA" / No results found for: "CEA1", "CEA" No results found for: "PSA1" No results found for: "BJY782" No results found for: "CAN125"  No results found for: "TOTALPROTELP", "ALBUMINELP", "A1GS", "A2GS", "BETS", "BETA2SER", "GAMS", "MSPIKE", "SPEI" No results found for: "TIBC", "FERRITIN", "IRONPCTSAT" No results found for: "LDH"  STUDIES:  Exam: 01/21/2024 Digital Screening Bilateral Mammogram with Tomosynthesis Impression: No mammographic sign of malignancy.    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   Malignant neoplasm of left ovary (HCC) 12/31/2012   Palpitations 07/02/2015   Postmenopausal bleeding 09/15/2016   Shortness of breath 07/02/2015   Tachycardia     Past Surgical History:  Procedure Laterality Date   CHOLECYSTECTOMY     KNEE SURGERY Left    left salpingoophorectomy Left    TMJ ARTHROPLASTY Right    TUBAL LIGATION      Family History  Problem Relation Age of Onset   Diabetes Mother    Cancer Mother    CAD Father    Diabetes Sister    CAD Sister    Cancer Sister    Diabetes Maternal  Grandmother    CAD Maternal Grandfather    Cancer Maternal Aunt     Social History:  reports that she has never smoked. She has never used smokeless tobacco. She reports that she does not drink alcohol and does not use drugs.The patient is alone today.  Allergies:  Allergies  Allergen Reactions   Codeine Itching, Nausea And Vomiting and Other (See Comments)   Other Nausea And Vomiting and Other (See Comments)    Real strong antibiotics    Current Medications: Current Outpatient Medications  Medication Sig Dispense Refill   Alpha Lipoic Acid 200 MG CAPS Take 1 tablet by mouth daily.     Coenzyme Q10 (CO Q-10) 100 MG CHEW Chew 1 tablet by mouth daily.     milk thistle 175 MG tablet Take 175 mg by mouth daily.     RHOFADE 1 % CREA Apply 1 Application topically every morning.     aspirin  EC (ASPIRIN  LOW DOSE) 81 MG tablet Take 1 tablet (81 mg total) by mouth 5 (five) times daily. SWALLOW WHOLE. (Patient taking differently: Take 81 mg by mouth once. SWALLOW WHOLE.) 90 tablet 3   Cholecalciferol (VITAMIN D-3 PO) Take 2 tablets by mouth daily. Unsure of unit dose     Cyanocobalamin (B-12 PO) Take 1 tablet by mouth daily. Unsure of mcg dose     ezetimibe (ZETIA) 10 MG tablet Take 10 mg by mouth daily.     gabapentin  (NEURONTIN ) 300 MG capsule Take 1 capsule (300 mg  total) by mouth 2 (two) times daily. 60 capsule 5   magnesium oxide (MAG-OX) 400 (240 Mg) MG tablet Take 400 mg by mouth every other day.     metoprolol  tartrate (LOPRESSOR ) 25 MG tablet TAKE 1 TABLET BY MOUTH TWICE A DAY 180 tablet 3   metroNIDAZOLE (METROGEL) 1 % gel Apply 1 Application topically daily.     MOUNJARO 5 MG/0.5ML Pen Inject 5 mg into the skin once a week.     OVER THE COUNTER MEDICATION Take 1 Capful by mouth daily. Curamed supplement 1 capsule daily     RESTASIS 0.05 % ophthalmic emulsion Place 1 drop into both eyes 2 (two) times daily.     No current facility-administered medications for this visit.    Facility-Administered Medications Ordered in Other Visits  Medication Dose Route Frequency Provider Last Rate Last Admin   sodium chloride  flush (NS) 0.9 % injection 10 mL  10 mL Intracatheter PRN Baldomero Bone A, NP   10 mL at 11/20/21 1052   ASSESSMENT & PLAN:  Assessment: Remote history of granulosa cell carcinoma of the left ovary.  She remains without evidence of recurrence.  Tumor markers are pending from today.    Persistent elevation of the liver enzymes, with findings of fatty liver on CT imaging.   Neuropathy. I think she would benefit from a higher dose of Gabapentin  and will increase it to 300 mg at bedtime and titrate up to BID.   Sjogren's syndrome with multiple symptoms of arthralgias and myalgias which are suspicious for some form of rheumatologic disorder.   Plan: She continues Gabapentin  100 mg BID but states that her pain continues to worsen. I will increase this to 300 mg once at bedtime before titrating up to BID and the neurologist can titrate from there or recommend an alternative. She has an appointment with her neurologist/RA specialist on 03/10/2024. Her PCP thinks her symptoms can be caused by autoimmune hepatitis or other autoimmune disease. I do think it seems to be some form of rheumatologic disorder, she already has known Sjogren's symdrome. She had a screening bilateral mammogram done on 01/21/2024 which was clear. She has a WBC of 9.2, hemoglobin of 12.2, and platelet count of 237,000. Her CMP is normal other than a non-fasting glucose of 162. Her inhibin B and anti-mullerian hormone (AMH) levels are pending today. I will see her back in 6 months with CBC, CMP, inhibin B, AMH, and port flush. The patient understands the plans discussed today and is in agreement with them.  She knows to contact our office if she develops concerns prior to her next appointment.    I provided 25 minutes of face-to-face time during this encounter and > 50% was spent counseling as  documented under my assessment and plan.   Nolia Baumgartner, MD   [facility] - A DEPT OF [facility] [address] [place] [address] Dept: [phone] Dept Fax: [phone]   No orders of the defined types were placed in this encounter.    I,Jasmine M Lassiter,acting as a scribe for Nolia Baumgartner, MD.,have documented all relevant documentation on the behalf of Nolia Baumgartner, MD,as directed by  Nolia Baumgartner, MD while in the presence of Nolia Baumgartner, MD.

## 2024-01-21 ENCOUNTER — Inpatient Hospital Stay: Attending: Oncology

## 2024-01-21 ENCOUNTER — Inpatient Hospital Stay (HOSPITAL_BASED_OUTPATIENT_CLINIC_OR_DEPARTMENT_OTHER): Admitting: Oncology

## 2024-01-21 ENCOUNTER — Encounter: Payer: Self-pay | Admitting: Oncology

## 2024-01-21 ENCOUNTER — Other Ambulatory Visit: Payer: Self-pay | Admitting: Oncology

## 2024-01-21 ENCOUNTER — Encounter: Payer: Self-pay | Admitting: Hematology and Oncology

## 2024-01-21 ENCOUNTER — Telehealth: Payer: Self-pay | Admitting: Oncology

## 2024-01-21 ENCOUNTER — Encounter: Payer: Self-pay | Admitting: Cardiology

## 2024-01-21 ENCOUNTER — Ambulatory Visit: Attending: Cardiology | Admitting: Cardiology

## 2024-01-21 VITALS — BP 128/88 | HR 82 | Ht 68.0 in | Wt 202.4 lb

## 2024-01-21 VITALS — BP 132/86 | HR 87 | Temp 97.9°F | Resp 18 | Ht 68.0 in | Wt 202.0 lb

## 2024-01-21 DIAGNOSIS — G620 Drug-induced polyneuropathy: Secondary | ICD-10-CM

## 2024-01-21 DIAGNOSIS — C562 Malignant neoplasm of left ovary: Secondary | ICD-10-CM

## 2024-01-21 DIAGNOSIS — G629 Polyneuropathy, unspecified: Secondary | ICD-10-CM | POA: Diagnosis not present

## 2024-01-21 DIAGNOSIS — Z8543 Personal history of malignant neoplasm of ovary: Secondary | ICD-10-CM | POA: Insufficient documentation

## 2024-01-21 DIAGNOSIS — E119 Type 2 diabetes mellitus without complications: Secondary | ICD-10-CM | POA: Diagnosis not present

## 2024-01-21 DIAGNOSIS — Z9221 Personal history of antineoplastic chemotherapy: Secondary | ICD-10-CM | POA: Diagnosis not present

## 2024-01-21 DIAGNOSIS — R072 Precordial pain: Secondary | ICD-10-CM

## 2024-01-21 DIAGNOSIS — R079 Chest pain, unspecified: Secondary | ICD-10-CM | POA: Diagnosis not present

## 2024-01-21 DIAGNOSIS — K760 Fatty (change of) liver, not elsewhere classified: Secondary | ICD-10-CM | POA: Insufficient documentation

## 2024-01-21 DIAGNOSIS — I4711 Inappropriate sinus tachycardia, so stated: Secondary | ICD-10-CM

## 2024-01-21 DIAGNOSIS — E785 Hyperlipidemia, unspecified: Secondary | ICD-10-CM | POA: Diagnosis not present

## 2024-01-21 DIAGNOSIS — M3500 Sicca syndrome, unspecified: Secondary | ICD-10-CM | POA: Diagnosis not present

## 2024-01-21 DIAGNOSIS — Z79899 Other long term (current) drug therapy: Secondary | ICD-10-CM | POA: Insufficient documentation

## 2024-01-21 DIAGNOSIS — T451X5A Adverse effect of antineoplastic and immunosuppressive drugs, initial encounter: Secondary | ICD-10-CM | POA: Insufficient documentation

## 2024-01-21 HISTORY — DX: Drug-induced polyneuropathy: G62.0

## 2024-01-21 LAB — CMP (CANCER CENTER ONLY)
ALT: 24 U/L (ref 0–44)
AST: 26 U/L (ref 15–41)
Albumin: 4.1 g/dL (ref 3.5–5.0)
Alkaline Phosphatase: 124 U/L (ref 38–126)
Anion gap: 13 (ref 5–15)
BUN: 10 mg/dL (ref 6–20)
CO2: 25 mmol/L (ref 22–32)
Calcium: 9.1 mg/dL (ref 8.9–10.3)
Chloride: 103 mmol/L (ref 98–111)
Creatinine: 0.94 mg/dL (ref 0.44–1.00)
GFR, Estimated: >60 mL/min (ref >60)
Glucose, Bld: 162 mg/dL — ABNORMAL HIGH (ref 70–99)
Potassium: 3.5 mmol/L (ref 3.5–5.1)
Sodium: 141 mmol/L (ref 135–145)
Total Bilirubin: 0.3 mg/dL (ref 0.0–1.2)
Total Protein: 7.1 g/dL (ref 6.5–8.1)

## 2024-01-21 LAB — CBC WITH DIFFERENTIAL (CANCER CENTER ONLY)
Abs Immature Granulocytes: 0.04 10*3/uL (ref 0.00–0.07)
Basophils Absolute: 0 10*3/uL (ref 0.0–0.1)
Basophils Relative: 0 %
Eosinophils Absolute: 0.2 10*3/uL (ref 0.0–0.5)
Eosinophils Relative: 2 %
HCT: 38.7 % (ref 36.0–46.0)
Hemoglobin: 12.2 g/dL (ref 12.0–15.0)
Immature Granulocytes: 0 %
Lymphocytes Relative: 31 %
Lymphs Abs: 2.8 10*3/uL (ref 0.7–4.0)
MCH: 26.5 pg (ref 26.0–34.0)
MCHC: 31.5 g/dL (ref 30.0–36.0)
MCV: 84.1 fL (ref 80.0–100.0)
Monocytes Absolute: 0.4 10*3/uL (ref 0.1–1.0)
Monocytes Relative: 5 %
Neutro Abs: 5.7 10*3/uL (ref 1.7–7.7)
Neutrophils Relative %: 62 %
Platelet Count: 237 10*3/uL (ref 150–400)
RBC: 4.6 MIL/uL (ref 3.87–5.11)
RDW: 14 % (ref 11.5–15.5)
WBC Count: 9.2 10*3/uL (ref 4.0–10.5)
nRBC: 0 % (ref 0.0–0.2)
nRBC: 0 /100{WBCs}

## 2024-01-21 LAB — HM MAMMOGRAPHY

## 2024-01-21 MED ORDER — GABAPENTIN 300 MG PO CAPS
300.0000 mg | ORAL_CAPSULE | Freq: Two times a day (BID) | ORAL | 5 refills | Status: DC
Start: 2024-01-21 — End: 2024-07-26

## 2024-01-21 NOTE — Addendum Note (Signed)
 Addended by: Shawnee Dellen D on: 01/21/2024 03:11 PM   Modules accepted: Orders

## 2024-01-21 NOTE — Patient Instructions (Addendum)
 Medication Instructions:   TAKE: Metoprolol  Tartrate 25mg  4 tablets 2 hours prior to CT scan   Lab Work: None Ordered If you have labs (blood work) drawn today and your tests are completely normal, you will receive your results only by: MyChart Message (if you have MyChart) OR A paper copy in the mail If you have any lab test that is abnormal or we need to change your treatment, we will call you to review the results.   Testing/Procedures:  Your cardiac CT will be scheduled at one of the below locations:   [facility] [address] [place], [place] [address]  Please follow these instructions carefully (unless otherwise directed):    On the Night Before the Test: Be sure to Drink plenty of water. Do not consume any caffeinated/decaffeinated beverages or chocolate 12 hours prior to your test. Do not take any antihistamines 12 hours prior to your test.   On the Day of the Test: Drink plenty of water until 1 hour prior to the test. Do not eat any food 4 hours prior to the test. You may take your regular medications prior to the test.  Take metoprolol  (Lopressor ) two hours prior to test. FEMALES- please wear underwire-free bra if available, avoid dresses & tight clothing       After the Test: Drink plenty of water. After receiving IV contrast, you may experience a mild flushed feeling. This is normal. On occasion, you may experience a mild rash up to 24 hours after the test. This is not dangerous. If this occurs, you can take Benadryl 25 mg and increase your fluid intake. If you experience trouble breathing, this can be serious. If it is severe call 911 IMMEDIATELY. If it is mild, please call our office. If you take any of these medications: Glipizide/Metformin , Avandament, Glucavance, please do not take 48 hours after completing test unless otherwise instructed.  We will call to schedule your test 2-4 weeks out understanding that some insurance companies will need  an authorization prior to the service being performed.   For non-scheduling related questions, please contact the cardiac imaging nurse navigator should you have any questions/concerns: Jinger Mount, Cardiac Imaging Nurse Navigator Chase Copping, Cardiac Imaging Nurse Navigator [facility] Direct Office Dial: [phone]   For scheduling needs, including cancellations and rescheduling, please call [place], [phone].    Follow-Up: At [facility], you and your health needs are our priority.  As part of our continuing mission to provide you with exceptional heart care, we have created designated Provider Care Teams.  These Care Teams include your primary Cardiologist (physician) and Advanced Practice Providers (APPs -  Physician Assistants and Nurse Practitioners) who all work together to provide you with the care you need, when you need it.  We recommend signing up for the patient portal called "MyChart".  Sign up information is provided on this After Visit Summary.  MyChart is used to connect with patients for Virtual Visits (Telemedicine).  Patients are able to view lab/test results, encounter notes, upcoming appointments, etc.  Non-urgent messages can be sent to your provider as well.   To learn more about what you can do with MyChart, go to [email or web address].    Your next appointment:   6 month(s)  The format for your next appointment:   In Person  Provider:   Ralene Burger, MD    Other Instructions NA

## 2024-01-21 NOTE — Progress Notes (Addendum)
 Cardiology Office Note:    Date:  01/21/2024   ID:  Nicole Beck, DOB May 02, 1966, MRN [id number]  PCP:  Duwaine Donnice PARAS, NP-C  Cardiologist:  Lamar Fitch, MD    Referring MD: Duwaine Donnice PARAS, NP-C   No chief complaint on file.   History of Present Illness:    Nicole Beck is a 58 y.o. female past medical history significant for inappropriate sinus tachycardia, essential hypertension, dyslipidemia, diabetes came to me today because few days ago she experienced chest pain.  She said she was getting ready to go to bed started having tightness in the chest tightness lasted for few minutes not much sweating no shortness of breath then tightness went away since that time she is doing fine.  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   aspirin  EC (ASPIRIN  LOW DOSE) 81 MG tablet Take 1 tablet (81 mg total) by mouth 5 (five) times daily. SWALLOW WHOLE. (Patient taking differently: Take 81 mg by mouth once. SWALLOW WHOLE.)   Cholecalciferol (VITAMIN D-3 PO) Take 2 tablets by mouth daily. Unsure of unit dose   Coenzyme Q10 (CO Q-10) 100 MG CHEW Chew 1 tablet by mouth daily.   Cyanocobalamin (B-12 PO) Take 1 tablet by mouth daily. Unsure of mcg  dose   ezetimibe (ZETIA) 10 MG tablet Take 10 mg by mouth daily.   gabapentin  (NEURONTIN ) 300 MG capsule Take 1 capsule (300 mg total) by mouth 2 (two) times daily.   magnesium oxide (MAG-OX) 400 (240 Mg) MG tablet Take 400 mg by mouth every other day.   metoprolol  tartrate (LOPRESSOR ) 25 MG tablet TAKE 1 TABLET BY MOUTH TWICE A DAY   metroNIDAZOLE (METROGEL) 1 % gel Apply 1 Application topically daily.   milk thistle 175 MG tablet Take 175 mg by mouth daily.   MOUNJARO 5 MG/0.5ML Pen Inject 5 mg into the skin once a week.   OVER THE COUNTER MEDICATION Take 1 Capful by mouth daily. Curamed supplement 1 capsule daily   RESTASIS 0.05 % ophthalmic emulsion Place 1 drop into both eyes 2 (two) times daily.   RHOFADE 1 % CREA Apply 1 Application topically every morning.   [DISCONTINUED] Bempedoic Acid  (NEXLETOL ) 180 MG TABS Take 1 tablet (180 mg total) by mouth daily.   [DISCONTINUED] doxycycline (VIBRAMYCIN) 100 MG capsule Take 100 mg by mouth daily.   [DISCONTINUED] gabapentin  (NEURONTIN ) 100 MG capsule Take 100 mg by mouth 2 (two) times daily.   [DISCONTINUED] gabapentin  (NEURONTIN ) 100 MG capsule Take 100 mg by mouth 2 (two) times daily.   [DISCONTINUED] metFORMIN  (GLUCOPHAGE ) 850 MG tablet Take 850 mg by mouth daily with breakfast.   [DISCONTINUED] MOUNJARO 2.5  MG/0.5ML Pen Inject 2.5 mg into the skin once a week.   [DISCONTINUED] predniSONE (DELTASONE) 20 MG tablet Take 20 mg by mouth daily with breakfast.   [DISCONTINUED] pregabalin (LYRICA) 50 MG capsule Take 50 mg by mouth 2 (two) times daily.     Allergies:   Codeine and Other   Social History   Socioeconomic History   Marital status: Married    Spouse name: Not on file   Number of children: Not on file   Years of education: Not on file   Highest education level: Not on file  Occupational History   Not on file  Tobacco Use   Smoking status: Never   Smokeless tobacco: Never  Substance and Sexual Activity   Alcohol use: Never    Drug use: Never   Sexual activity: Yes  Other Topics Concern   Not on file  Social History Narrative   ** Merged History Encounter **       Social Drivers of Health   Financial Resource Strain: Low Risk  (12/30/2022)   Received from [facility], [facility]   Overall Financial Resource Strain (CARDIA)    Difficulty of Paying Living Expenses: Not hard at all  Food Insecurity: No Food Insecurity (12/30/2022)   Received from [facility], [facility]   Hunger Vital Sign    Worried About Running Out of Food in the Last Year: Never true    Ran Out of Food in the Last Year: Never true  Transportation Needs: No Transportation Needs (12/30/2022)   Received from [facility], [facility]   PRAPARE - Transportation    Lack of Transportation (Medical): No    Lack of Transportation (Non-Medical): No  Physical Activity: Not on file  Stress: Not on file  Social Connections: Unknown (12/30/2022)   Received from [facility], [facility]   Social Network    Social Network: Not on file     Family History: The patient's family history includes CAD in her father, maternal grandfather, and sister; Cancer in her maternal aunt, mother, and sister; Diabetes in her maternal grandmother, mother, and sister. ROS:   Please see the history of present illness.    All 14 point review of systems negative except as described per history of present illness  EKGs/Labs/Other Studies Reviewed:    EKG Interpretation Date/Time:  Thursday January 21 2024 14:23:18 EDT Ventricular Rate:  82 PR Interval:  164 QRS Duration:  76 QT Interval:  394 QTC Calculation: 460 R Axis:   -14  Text Interpretation: Normal sinus rhythm Normal ECG When compared with ECG of 13-Jul-2023 14:05, No significant change was found Confirmed by Bernie Charleston [phone]) on 01/21/2024 2:37:40 PM    Recent Labs: 01/21/2024: ALT 24; BUN 10; Creatinine 0.94; Hemoglobin 12.2; Platelet Count 237; Potassium 3.5; Sodium 141  Recent  Lipid Panel    Component Value Date/Time   CHOL 166 07/18/2021 1023   TRIG 179 (H) 07/18/2021 1023   HDL 51 07/18/2021 1023   CHOLHDL 3.3 07/18/2021 1023   CHOLHDL 5.0 05/20/2021 1050   VLDL 40 05/20/2021 1050   LDLCALC 85 07/18/2021 1023    Physical Exam:    VS:  BP 128/88 (BP Location: Right Arm, Patient Position: Sitting)   Pulse 82   Ht 5\' 8"  (1.727 m)   Wt 202 lb 6.4 oz (91.8 kg)   SpO2 98%   BMI 30.77 kg/m     Wt Readings from Last 3 Encounters:  01/21/24 202 lb 6.4 oz (91.8  kg)  01/21/24 202 lb (91.6 kg)  07/13/23 215 lb (97.5 kg)     GEN:  Well nourished, well developed in no acute distress HEENT: Normal NECK: No JVD; No carotid bruits LYMPHATICS: No lymphadenopathy CARDIAC: RRR, no murmurs, no rubs, no gallops RESPIRATORY:  Clear to auscultation without rales, wheezing or rhonchi  ABDOMEN: Soft, non-tender, non-distended MUSCULOSKELETAL:  No edema; No deformity  SKIN: Warm and dry LOWER EXTREMITIES: no swelling NEUROLOGIC:  Alert and oriented x 3 PSYCHIATRIC:  Normal affect   ASSESSMENT:    1. Chest pain, unspecified type   2. Inappropriate sinus tachycardia (HCC)   3. Diabetes mellitus without complication (HCC)   4. Dyslipidemia   5. Chest pain of uncertain etiology    PLAN:    In order of problems listed above:  Chest pain somewhat atypical we will schedule her to have coronary CT angio in the meantime continue antiplatelets therapy. Inappropriate sinus tachycardia that seems to be controlled. Dyslipidemia I do have her cholesterol from 2022.  Will call primary care physician to get more updated information's.  This is a being dictated after insurance company refused to pay for coronary CT angio.  Patient with chest pain and diabetes are considered at least moderate risk for significant coronary artery disease.  Excuse due to insurance company used not to pay for coronary CT angio is the fact that does not meet criteria for policy [id number].   Below is the abstract for the policy I did clearly and obviously states that coronary CT angio is performed to avoid performing cardiac catheterization patient with chest pain syndrome with i intermediate possibility of coronary artery disease.  As stated in my note before.  That chest tightness that she developed that prompted me to scheduling her for the test to happen with making her bed so there was exercise involved.  Lasted few minutes.  Adding diabetes which obviously increase the chance of having significant coronary disease coronary CT angio will be the best test we can do to try to answer the question she gets significant coronary disease.  "? CCTA is being performed to avoid performing cardiac catheterization in  patients with chest pain syndrome with intermediate pretest probability of  CAD, uninterpretable ECG and are not able to exercise with no prior CCTA  done within the last 12 months who have: (15,16)"   Medication Adjustments/Labs and Tests Ordered: Current medicines are reviewed at length with the patient today.  Concerns regarding medicines are outlined above.  Orders Placed This Encounter  Procedures   EKG 12-Lead   Medication changes: No orders of the defined types were placed in this encounter.   Signed, Lamar DOROTHA Fitch, MD, [facility] 01/21/2024 2:56 PM    [facility]

## 2024-01-21 NOTE — Telephone Encounter (Signed)
 Patient has been scheduled for follow-up visit per 01/21/24 LOS.  Pt given an appt calendar with date and time.

## 2024-01-26 LAB — INHIBIN B: Inhibin B: <7 pg/mL

## 2024-01-29 ENCOUNTER — Encounter: Payer: Self-pay | Admitting: Oncology

## 2024-01-31 ENCOUNTER — Encounter: Payer: Self-pay | Admitting: Hematology and Oncology

## 2024-02-02 ENCOUNTER — Telehealth: Payer: Self-pay

## 2024-02-02 NOTE — Telephone Encounter (Signed)
Called patient and notified her of the message

## 2024-02-02 NOTE — Telephone Encounter (Signed)
-----   Message from Nolia Baumgartner sent at 01/31/2024  5:43 PM EDT ----- Regarding: call Tell her Inhibin B is normal

## 2024-02-18 ENCOUNTER — Encounter (HOSPITAL_COMMUNITY): Payer: Self-pay

## 2024-02-23 ENCOUNTER — Ambulatory Visit (HOSPITAL_BASED_OUTPATIENT_CLINIC_OR_DEPARTMENT_OTHER): Admission: RE | Admit: 2024-02-23 | Source: Ambulatory Visit

## 2024-03-24 ENCOUNTER — Other Ambulatory Visit (HOSPITAL_BASED_OUTPATIENT_CLINIC_OR_DEPARTMENT_OTHER): Admitting: Radiology

## 2024-04-12 ENCOUNTER — Encounter: Payer: Self-pay | Admitting: Oncology

## 2024-04-19 ENCOUNTER — Other Ambulatory Visit: Payer: Self-pay

## 2024-04-19 ENCOUNTER — Telehealth: Payer: Self-pay

## 2024-04-19 DIAGNOSIS — M791 Myalgia, unspecified site: Secondary | ICD-10-CM

## 2024-04-19 NOTE — Telephone Encounter (Signed)
 Referral sent to [facility] via EPIC.

## 2024-04-28 NOTE — Progress Notes (Signed)
 Subjective   Chief Complaint  Patient presents with  . Sore Throat  . Fatigue    Husband has flu B and Covid , took Covid at home and was negative, will swab for flu and strep  . Headache    Patient ID:  Nicole Beck is a 58 y.o. female who complains of sore throat, post nasal drip, and fatigue for 3 day(s). She denies fever, nausea, vomiting, diarrhea. Husband has COVID and influenza B. Home COVID test yesterday and today were negative. Symptoms managed with throat lozenges.  Patient also reports persistent swelling to her left ankle with stiffness and discomfort. Seen for same on 04/15/24 and treated for gout flare with Solu-Medrol IM, prednisone taper, and colchicine. Symptoms also managed with epsom salt soaks and elevation.    Reviewed and updated this visit by provider: Tobacco  Allergies  Meds  Problems  Med Hx  Surg Hx  Fam Hx      Objective   Vitals:   04/28/24 1149  BP: 110/68  Pulse: 103  Temp: 97.9 F (36.6 C)  TempSrc: Temporal  Resp: 20  Height: 5' 8 (1.727 m)  Weight: 201 lb 6.4 oz (91.4 kg)  SpO2: 98%  BMI (Calculated): 30.6   Physical Exam Vitals and nursing note reviewed.  Constitutional:      General: She is not in acute distress.    Appearance: Normal appearance.  HENT:     Head: Normocephalic and atraumatic.     Right Ear: Tympanic membrane, ear canal and external ear normal.     Left Ear: Tympanic membrane, ear canal and external ear normal.     Nose: Nose normal.     Mouth/Throat:     Mouth: Mucous membranes are moist.     Pharynx: Posterior oropharyngeal erythema present. No oropharyngeal exudate.  Eyes:     General: No scleral icterus.    Conjunctiva/sclera: Conjunctivae normal.  Cardiovascular:     Rate and Rhythm: Normal rate and regular rhythm.     Pulses: Normal pulses.     Heart sounds: Normal heart sounds.  Musculoskeletal:     Cervical back: Neck supple.     Left ankle: Swelling present. No deformity. Tenderness  present over the lateral malleolus. Normal range of motion.     Left foot: Normal.  Pulmonary:     Effort: Pulmonary effort is normal. No respiratory distress.     Breath sounds: Normal breath sounds. No wheezing, rhonchi or rales.  Lymphadenopathy:     Cervical: No cervical adenopathy.  Skin:    General: Skin is warm.     Findings: No bruising or ecchymosis.  Neurological:     Mental Status: She is alert and oriented to person, place, and time.  Psychiatric:        Behavior: Behavior normal.    Results for orders placed or performed in visit on 04/28/24  POCT Strep Nucleic Acid   Collection Time: 04/28/24 12:05 PM  Result Value Ref Range   Strep Negative Negative  POCT Flu Nucleic Acid   Collection Time: 04/28/24 12:10 PM  Result Value Ref Range   Influenza A/B Negative Negative    XR Ankle Min 3 Views Left Result Date: 04/28/2024 XR ANKLE MIN 3 VIEWS LEFT: COMPARISON: No comparison. FINDINGS: Anatomic alignment. No fracture. Small plantar calcaneal heel spur. Mortise joint and talar dome normal. No joint space narrowing. No foreign body.   IMPRESSION: NO ACUTE ABNORMALITY. Electronically Signed by: Ferdie MORTON Hession on 04/28/2024 12:37 PM  Assessment  MDM DDx include but are not limited to: viral URI, strep pharyngitis, influenza, COVID, gout, pseudogout, osteoarthritis, sprain  Nicole Beck was seen today for sore throat, fatigue and headache. Diagnoses and all orders for this visit: 1. Viral URI (Primary) -     POCT Strep Nucleic Acid -     POCT Flu Nucleic Acid 2. Sore throat -     POCT Strep Nucleic Acid -     POCT Flu Nucleic Acid 3. Pain and swelling of ankle, left -     XR Ankle Min 3 Views Left; Future 4. Exposure to influenza -     POCT Flu Nucleic Acid  Plan   POCT strep and flu negative. Home COVID negative x 2.  X-Ray is negative for acute abnormality.   Symptomatic therapy suggested: push fluids, rest, gargle warm salt water, and use Flonase prn. Ace  wrap for ankle  Follow-up: Follow up if symptoms worsen or fail to improve, for Primary Care Provider.   Risks, benefits, and alternatives of the medications and treatment plan prescribed today were discussed, and patient expressed understanding. Reviewed ER/return precautions; patient verbalized understanding.  I have reviewed the information contained in this note and personally verified its accuracy. I obtained the history of present illness and personally performed the physical exam.  Portions of the history and exam were entered using voice recognition software. Minor syntax, contextual, and spelling errors may be related to the use of this software and were not intentional. If corrections are necessary, please contact provider.

## 2024-04-29 ENCOUNTER — Inpatient Hospital Stay: Admitting: Oncology

## 2024-06-13 NOTE — Progress Notes (Deleted)
 Office Visit Note  Patient: Nicole Beck             Date of Birth: 08-04-1966           MRN: [id number]             PCP: Duwaine Donnice PARAS, NP-C Referring: Cornelius Wanda DEL, MD Visit Date: 06/27/2024 Occupation: @GUAROCC @  Subjective:  No chief complaint on file.  2024: Mitochondrial Abs + x2 ANA 1:160  History of Present Illness: Nicole Beck is a 58 y.o. female ***   Activities of Daily Living:  Patient reports morning stiffness for *** {minute/hour:19697}.   Patient {ACTIONS;DENIES/REPORTS:21021675::Denies} nocturnal pain.  Difficulty dressing/grooming: {ACTIONS;DENIES/REPORTS:21021675::Denies} Difficulty climbing stairs: {ACTIONS;DENIES/REPORTS:21021675::Denies} Difficulty getting out of chair: {ACTIONS;DENIES/REPORTS:21021675::Denies} Difficulty using hands for taps, buttons, cutlery, and/or writing: {ACTIONS;DENIES/REPORTS:21021675::Denies}  No Rheumatology ROS completed.    Rheum History: # Diagnosed in ***.  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    Class: Chronic   Plantar fasciitis of right foot 11/27/2023   Lumbar spondylosis 10/30/2023   Positive ANA (antinuclear antibody) 10/26/2023   Hepatic steatosis 03/24/2023   Gastroesophageal reflux disease without esophagitis 02/11/2023   Dyslipidemia 06/24/2022   Diabetes mellitus without complication (HCC) 08/31/2021   Tubular adenoma of colon 01/10/2021    Class: History of   Tachycardia    Atrial tachycardia (HCC) 10/13/2016   Inappropriate sinus tachycardia (HCC) 10/13/2016   Postmenopausal bleeding 09/15/2016   Shortness of breath 07/02/2015   Palpitations 07/02/2015   Chest pain of uncertain etiology 07/02/2015    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    Past Medical History:  Diagnosis Date   Atypical chest pain 07/02/2015   Diabetes mellitus without complication (HCC)    diet controlled   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   Inappropriate sinus tachycardia (HCC) 10/13/2016   Malignant neoplasm of left ovary (HCC) 12/31/2012   Palpitations 07/02/2015   Postmenopausal bleeding 09/15/2016   Shortness of breath 07/02/2015   Tachycardia     Family History  Problem Relation Age of Onset   Diabetes Mother    Cancer Mother    CAD Father    Diabetes Sister    CAD Sister    Cancer Sister    Diabetes Maternal Grandmother    CAD Maternal Grandfather    Cancer Maternal Aunt    Past Surgical History:  Procedure Laterality Date   CHOLECYSTECTOMY     KNEE SURGERY Left    left salpingoophorectomy Left    TMJ ARTHROPLASTY Right    TUBAL LIGATION     Social History   Social History Narrative   ** Merged History Encounter **       Immunization History  Administered Date(s) Administered   PFIZER(Purple Top)SARS-COV-2 Vaccination 06/25/2020, 07/18/2020     Objective: Vital Signs: There were no vitals taken for this visit.   Physical Exam Vitals and nursing note reviewed.  HENT:     Head: Normocephalic and atraumatic.     Nose: Nose normal.  Eyes:     Conjunctiva/sclera: Conjunctivae normal.     Pupils: Pupils are equal, round, and reactive to light.  Cardiovascular:     Rate and Rhythm: Normal rate and regular rhythm.     Heart  sounds: Normal heart sounds.  Pulmonary:     Effort: Pulmonary effort is normal.     Breath sounds: Normal breath sounds.  Skin:    General: Skin is warm and dry.  Neurological:     Mental Status: She is alert. Mental status is at baseline.  Psychiatric:        Mood and Affect: Mood normal.        Behavior: Behavior normal.      Musculoskeletal Exam: ***  CDAI Exam: CDAI Score: -- Patient Global: --; Provider Global:  -- Swollen: --; Tender: -- Joint Exam 06/27/2024   No joint exam has been documented for this visit   There is currently no information documented on the homunculus. Go to the Rheumatology activity and complete the homunculus joint exam.  Investigation: No additional findings.  Imaging: No results found.  Recent Labs: Lab Results  Component Value Date   WBC 9.2 01/21/2024   HGB 12.2 01/21/2024   PLT 237 01/21/2024   NA 141 01/21/2024   K 3.5 01/21/2024   CL 103 01/21/2024   CO2 25 01/21/2024   GLUCOSE 162 (H) 01/21/2024   BUN 10 01/21/2024   CREATININE 0.94 01/21/2024   BILITOT 0.3 01/21/2024   ALKPHOS 124 01/21/2024   AST 26 01/21/2024   ALT 24 01/21/2024   PROT 7.1 01/21/2024   ALBUMIN 4.1 01/21/2024   CALCIUM  9.1 01/21/2024   GFRAA 80 05/03/2018   No results found for: ANA, RF  Speciality Comments: No specialty comments available.  Procedures:  No procedures performed Allergies: Codeine and Other   Assessment / Plan:     Visit Diagnoses: No diagnosis found.  No definitive inflammatory arthritis or other compelling features for clinically active systemic autoimmune disorder serving as a unifying diagnosis for patient's overall presentation. Positive ANA need not represent presence of clinically active systemic autoimmune disease.  Can be positive in the normal population, autoimmune thyroid disease (Graves' disease, Hashimoto's thyroiditis etc.), or infection. ANA can be positive in the healthy population at the following rates: ANA 1:40: 20%-30%, ANA 1:80: 10%-15%, ANA 1:160: 5%, ANA 1:320: 3% positive, healthy relative of an SLE patient: 5%-25% positive (usually low titers), and elderly (age >70 years): up to 70% positive at ANA titer 1:40.   Orders: No orders of the defined types were placed in this encounter.  No orders of the defined types were placed in this encounter.   I personally spent a total of *** minutes in the care of the patient today  including {Time Based Coding:210964241}.  Follow-Up Instructions: No follow-ups on file.   Asberry Claw, DO

## 2024-06-27 ENCOUNTER — Encounter

## 2024-06-27 DIAGNOSIS — R768 Other specified abnormal immunological findings in serum: Secondary | ICD-10-CM

## 2024-07-18 ENCOUNTER — Other Ambulatory Visit: Payer: Self-pay | Admitting: Cardiology

## 2024-07-22 ENCOUNTER — Ambulatory Visit: Admitting: Oncology

## 2024-07-22 ENCOUNTER — Other Ambulatory Visit

## 2024-07-26 ENCOUNTER — Encounter: Payer: Self-pay | Admitting: Oncology

## 2024-07-26 ENCOUNTER — Inpatient Hospital Stay: Payer: Self-pay | Attending: Oncology

## 2024-07-26 ENCOUNTER — Telehealth: Payer: Self-pay | Admitting: Oncology

## 2024-07-26 ENCOUNTER — Other Ambulatory Visit: Payer: Self-pay | Admitting: Oncology

## 2024-07-26 ENCOUNTER — Inpatient Hospital Stay (HOSPITAL_BASED_OUTPATIENT_CLINIC_OR_DEPARTMENT_OTHER): Payer: Self-pay | Admitting: Oncology

## 2024-07-26 VITALS — BP 132/86 | HR 90 | Temp 98.2°F | Resp 16 | Ht 68.0 in | Wt 205.0 lb

## 2024-07-26 DIAGNOSIS — G629 Polyneuropathy, unspecified: Secondary | ICD-10-CM | POA: Diagnosis not present

## 2024-07-26 DIAGNOSIS — G479 Sleep disorder, unspecified: Secondary | ICD-10-CM | POA: Diagnosis not present

## 2024-07-26 DIAGNOSIS — M542 Cervicalgia: Secondary | ICD-10-CM | POA: Diagnosis not present

## 2024-07-26 DIAGNOSIS — K760 Fatty (change of) liver, not elsewhere classified: Secondary | ICD-10-CM | POA: Diagnosis not present

## 2024-07-26 DIAGNOSIS — Z8543 Personal history of malignant neoplasm of ovary: Secondary | ICD-10-CM | POA: Diagnosis present

## 2024-07-26 DIAGNOSIS — C562 Malignant neoplasm of left ovary: Secondary | ICD-10-CM

## 2024-07-26 DIAGNOSIS — G8929 Other chronic pain: Secondary | ICD-10-CM | POA: Diagnosis not present

## 2024-07-26 DIAGNOSIS — R682 Dry mouth, unspecified: Secondary | ICD-10-CM | POA: Insufficient documentation

## 2024-07-26 DIAGNOSIS — M62838 Other muscle spasm: Secondary | ICD-10-CM | POA: Insufficient documentation

## 2024-07-26 DIAGNOSIS — M51369 Other intervertebral disc degeneration, lumbar region without mention of lumbar back pain or lower extremity pain: Secondary | ICD-10-CM | POA: Insufficient documentation

## 2024-07-26 DIAGNOSIS — R7401 Elevation of levels of liver transaminase levels: Secondary | ICD-10-CM | POA: Diagnosis not present

## 2024-07-26 DIAGNOSIS — Z1239 Encounter for other screening for malignant neoplasm of breast: Secondary | ICD-10-CM

## 2024-07-26 LAB — CBC WITH DIFFERENTIAL (CANCER CENTER ONLY)
Abs Immature Granulocytes: 0.04 K/uL (ref 0.00–0.07)
Basophils Absolute: 0.1 K/uL (ref 0.0–0.1)
Basophils Relative: 0 %
Eosinophils Absolute: 0.2 K/uL (ref 0.0–0.5)
Eosinophils Relative: 2 %
HCT: 40.5 % (ref 36.0–46.0)
Hemoglobin: 12.6 g/dL (ref 12.0–15.0)
Immature Granulocytes: 0 %
Lymphocytes Relative: 34 %
Lymphs Abs: 4 K/uL (ref 0.7–4.0)
MCH: 26 pg (ref 26.0–34.0)
MCHC: 31.1 g/dL (ref 30.0–36.0)
MCV: 83.7 fL (ref 80.0–100.0)
Monocytes Absolute: 0.7 K/uL (ref 0.1–1.0)
Monocytes Relative: 6 %
Neutro Abs: 6.8 K/uL (ref 1.7–7.7)
Neutrophils Relative %: 58 %
Platelet Count: 279 K/uL (ref 150–400)
RBC: 4.84 MIL/uL (ref 3.87–5.11)
RDW: 14.4 % (ref 11.5–15.5)
WBC Count: 11.7 K/uL — ABNORMAL HIGH (ref 4.0–10.5)
nRBC: 0 % (ref 0.0–0.2)

## 2024-07-26 LAB — CMP (CANCER CENTER ONLY)
ALT: 25 U/L (ref 0–44)
AST: 21 U/L (ref 15–41)
Albumin: 4.3 g/dL (ref 3.5–5.0)
Alkaline Phosphatase: 116 U/L (ref 38–126)
Anion gap: 13 (ref 5–15)
BUN: 13 mg/dL (ref 6–20)
CO2: 26 mmol/L (ref 22–32)
Calcium: 9.7 mg/dL (ref 8.9–10.3)
Chloride: 103 mmol/L (ref 98–111)
Creatinine: 0.91 mg/dL (ref 0.44–1.00)
GFR, Estimated: >60 mL/min (ref >60)
Glucose, Bld: 101 mg/dL — ABNORMAL HIGH (ref 70–99)
Potassium: 3.6 mmol/L (ref 3.5–5.1)
Sodium: 142 mmol/L (ref 135–145)
Total Bilirubin: 0.4 mg/dL (ref 0.0–1.2)
Total Protein: 7.3 g/dL (ref 6.5–8.1)

## 2024-07-26 NOTE — Progress Notes (Addendum)
 [facility]  [address] 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 She was treated with a left salpingo-oophorectomy, as the lesion appeared cystic. Pathology revealed a 9 cm granulosa cell tumor with rupture of the capsule. She received adjuvant chemotherapy with 4 cycles of bleomycin, etoposide and cisplatin. She did not receive all of the bleomycin doses because of respiratory problems, which included infection and pulmonary emboli. She was treated with Coumadin for 6 months for the pulmonary emboli. She had multiple toxicities from chemotherapy and required an admission for febrile neutropenia. Due to her history of ovarian cancer, she underwent testing for hereditary cancer syndromes with the Myriad myRisk Hereditary Cancer Panel test. This did not reveal any clinically significant mutation or any variants of uncertain significance. She has a right ovarian cyst, which is being followed, as the patient decided to delay right salpingo-oophorectomy and hysterectomy. She was found to have a low B12 level and was initially on monthly injections, then transitioned to oral. She has also had mild chronic hypokalemia, but does not like to take a potassium supplement. She has also had mild elevation of the liver transaminases, which have fluctuated up and down. CT abdomen and pelvis in January 2019 revealed a stable right ovarian cyst, normal appearing uterus, and no evidence of recurrent/metastatic disease. Screening mammography from February 2022 revealed a possible distortion in the left breast. Therefore diagnostic unilateral  left mammogram and ultrasound were pursued in March which confirmed no evidence of malignancy.  Colonoscopy in May 2022 with removal of a tubular adenoma.  Bilateral screening mammogram in March 2024 did not reveal any evidence of malignancy.  Oncology History  Granulosa cell carcinoma of ovary (HCC)  01/10/2013 Cancer Staging   Staging form: Ovary, AJCC 7th Edition - Clinical stage from 01/10/2013: FIGO Stage I (T1, N0, M0) - Signed by Nicole Wanda DEL, MD on 05/26/2021 Staged by: Managing physician Diagnostic confirmation: Positive histology Specimen type: Excision Histopathologic type: Granulosa cell-theca cell tumor Stage prefix: Initial diagnosis Laterality: Left Tumor size (mm): 90 Lymph-vascular invasion (LVI): LVI not present (absent)/not identified Residual tumor (R): R0 - None Gross residual tumor after primary cyto-reductive surgery: Absent Prognostic indicators: Stage IC, treated with adjuvant BEP chemo x 4 Stage used in treatment planning: Yes National guidelines used in treatment planning: Yes Type of national guideline used in treatment planning: NCCN   01/17/2013 Initial Diagnosis   Granulosa cell carcinoma of ovary (HCC)     INTERVAL HISTORY:  Nicole Beck is here today for repeat clinical assessment for her 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 Patient states that she feels well but complains of chronic pain everywhere, neck pain, worsening motor strength, dry mouth, lower extremity muscle spasm, and sleep disturbance due to pain. Her last x-ray of the lumbar spine was done in January, 2025 and revealed mild multilevel degenerative disc disease and lower lumbar predominant facet arthropathy. She states that she cannot stand or sit for extended periods of time without being in pain and is not a candidate for back surgery due to her heart condition. The patient informed  me that they are suspicious of Sjgren's syndrome and I agree, although it could  be systemic Lupus erythematosus. She receives an cortisone injection and pulse of oral Prednisone every 3 months. She also takes Ibuprofen prn to help alleviate her pain but at very low doses. She has a consultation with rheumatologist Nicole Beck coming up and her neurologist has also started her on Lyrica 25 mg once daily. She had a positive ANA of 1:160 on 09/15/2023. She reminded me that since receiving a COVID shot in 2020 she began to develop  arthralgias, myalgias, severe dryness of her mouth and eyes, insomnia, and decreased memory. She has since had COVID twice in her life. She has an elevated WBC of 11.7, hemoglobin of 12.6, and platelet count of 279,000. Her CMP is completely normal and her inhibin B and anti-mullerian hormone levels are pending. I will call her with these results. I will see her back in 6 months with CBC, CMP, AMH, inhibin B, and screening bilateral mammogram.   She denies fever, chills, night sweats, or other signs of infection. She denies cardiorespiratory and gastrointestinal issues. Her appetite is good and Her weight has increased 3 pounds over last 6 months. This patient is accompanied in the office by her husband.   REVIEW OF SYSTEMS:  Review of Systems  Constitutional:  Negative for appetite change, chills, fatigue, fever and unexpected weight change.  HENT:  Negative.  Negative for lump/mass, mouth sores, nosebleeds and sore throat.        Dry mouth  Eyes:  Positive for eye problems (Sjogrens syndrome).  Respiratory: Negative.  Negative for chest tightness, cough, hemoptysis, shortness of breath and wheezing.   Cardiovascular: Negative.  Negative for chest pain, leg swelling and palpitations.  Gastrointestinal: Negative.  Negative for abdominal distention, abdominal pain, blood in stool, constipation, diarrhea, nausea and vomiting.  Endocrine: Negative.  Negative for hot flashes.  Genitourinary: Negative.  Negative for difficulty urinating, dysuria, frequency,  hematuria and vaginal bleeding.   Musculoskeletal:  Positive for arthralgias (chronic), gait problem (imbalance), myalgias (chronic) and neck pain (chronic). Negative for back pain and flank pain.       Plantar fascitis and muscle spasms of the lower legs  Skin: Negative.  Negative for rash.  Neurological:  Positive for extremity weakness (and decreasing strength), gait problem (imbalance) and numbness (neuropathy). Negative for dizziness, headaches, light-headedness, seizures and speech difficulty.  Hematological: Negative.  Negative for adenopathy. Does not bruise/bleed easily.  Psychiatric/Behavioral:  Positive for depression (mild) and sleep disturbance (due to pain). The patient is nervous/anxious.        Decreased memory    VITALS:  Blood pressure 132/86, pulse 90, temperature 98.2 F (36.8 C), temperature source Oral, resp. rate 16, height 5' 8 (1.727 m), weight 205 lb (93 kg), SpO2 100%.  Wt Readings from Last 3 Encounters:  07/26/24 205 lb (93 kg)  01/21/24 202 lb 6.4 oz (91.8 kg)  01/21/24 202 lb (91.6 kg)    Body mass index is 31.17 kg/m.  Performance status (ECOG): 1 - Symptomatic but completely ambulatory  PHYSICAL EXAM:  Physical Exam Vitals and nursing note reviewed. Exam conducted with a chaperone present.  Constitutional:      General: She is not in acute distress.    Appearance: Normal appearance. She is normal weight. She is not ill-appearing, toxic-appearing or diaphoretic.  HENT:     Head: Normocephalic and atraumatic.     Right Ear: Tympanic membrane, ear canal and external ear normal. There  is no impacted cerumen.     Left Ear: Tympanic membrane, ear canal and external ear normal. There is no impacted cerumen.     Nose: Nose normal. No congestion or rhinorrhea.     Mouth/Throat:     Mouth: Mucous membranes are moist.     Pharynx: Oropharynx is clear. No oropharyngeal exudate or posterior oropharyngeal erythema.  Eyes:     General: No scleral icterus.        Right eye: No discharge.        Left eye: No discharge.     Extraocular Movements: Extraocular movements intact.     Conjunctiva/sclera: Conjunctivae normal.     Pupils: Pupils are equal, round, and reactive to light.  Neck:     Vascular: No carotid bruit.  Cardiovascular:     Rate and Rhythm: Normal rate and regular rhythm.     Pulses: Normal pulses.     Heart sounds: Normal heart sounds. No murmur heard.    No friction rub. No gallop.  Pulmonary:     Effort: Pulmonary effort is normal. No respiratory distress.     Breath sounds: Normal breath sounds. No stridor. No wheezing, rhonchi or rales.  Chest:     Chest wall: No tenderness.  Abdominal:     General: Bowel sounds are normal. There is no distension.     Palpations: Abdomen is soft. There is no hepatomegaly, splenomegaly or mass.     Tenderness: There is no abdominal tenderness. There is no right CVA tenderness, left CVA tenderness, guarding or rebound.     Hernia: No hernia is present.     Comments: Right upper quadrant scar which is well healed  Musculoskeletal:        General: No swelling, tenderness, deformity or signs of injury. Normal range of motion.     Cervical back: Normal range of motion and neck supple. No rigidity or tenderness.     Right lower leg: No edema.     Left lower leg: No edema.  Lymphadenopathy:     Cervical: No cervical adenopathy.     Right cervical: No superficial, deep or posterior cervical adenopathy.    Left cervical: No superficial, deep or posterior cervical adenopathy.     Upper Body:     Right upper body: No supraclavicular, axillary or pectoral adenopathy.     Left upper body: No supraclavicular, axillary or pectoral adenopathy.  Skin:    General: Skin is warm and dry.     Coloration: Skin is not jaundiced or pale.     Findings: No bruising, erythema, lesion or rash.  Neurological:     General: No focal deficit present.     Mental Status: She is alert and oriented to person, place, and  time. Mental status is at baseline.     Cranial Nerves: No cranial nerve deficit.     Sensory: No sensory deficit.     Motor: No weakness.     Coordination: Coordination normal.     Gait: Gait normal.     Deep Tendon Reflexes: Reflexes normal.     Comments: 4/5 motor strength   Psychiatric:        Mood and Affect: Mood normal.        Behavior: Behavior normal.        Thought Content: Thought content normal.        Judgment: Judgment normal.     LABS:      Latest Ref Rng & Units 07/26/2024  3:16 PM 01/21/2024   10:10 AM 07/13/2023   11:07 AM  CBC  WBC 4.0 - 10.5 K/uL 11.7  9.2  11.6   Hemoglobin 12.0 - 15.0 g/dL 87.3  87.7  87.5   Hematocrit 36.0 - 46.0 % 40.5  38.7  40.1   Platelets 150 - 400 K/uL 279  237  272       Latest Ref Rng & Units 07/26/2024    3:16 PM 01/21/2024   10:10 AM 07/13/2023   11:07 AM  CMP  Glucose 70 - 99 mg/dL 898  837  850   BUN 6 - 20 mg/dL 13  10  16    Creatinine 0.44 - 1.00 mg/dL 9.08  9.05  9.04   Sodium 135 - 145 mmol/L 142  141  137   Potassium 3.5 - 5.1 mmol/L 3.6  3.5  3.4   Chloride 98 - 111 mmol/L 103  103  103   CO2 22 - 32 mmol/L 26  25  23    Calcium  8.9 - 10.3 mg/dL 9.7  9.1  8.8   Total Protein 6.5 - 8.1 g/dL 7.3  7.1  7.7   Total Bilirubin 0.0 - 1.2 mg/dL 0.4  0.3  0.6   Alkaline Phos 38 - 126 U/L 116  124  100   AST 15 - 41 U/L 21  26  53   ALT 0 - 44 U/L 25  24  66    No results found for: CEA1, CEA / No results found for: CEA1, CEA No results found for: PSA1 No results found for: CAN199 No results found for: CAN125  No results found for: TOTALPROTELP, ALBUMINELP, A1GS, A2GS, BETS, BETA2SER, GAMS, MSPIKE, SPEI No results found for: TIBC, FERRITIN, IRONPCTSAT No results found for: LDH  STUDIES:  Exam: 01/21/2024 Digital Screening Bilateral Mammogram with Tomosynthesis Impression: No mammographic sign of malignancy.    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/13/2016   Malignant neoplasm of left ovary (HCC) 12/31/2012   Palpitations 07/02/2015   Postmenopausal bleeding 09/15/2016   Shortness of breath 07/02/2015   Tachycardia     Past Surgical History:  Procedure Laterality Date   CHOLECYSTECTOMY     KNEE SURGERY Left    left salpingoophorectomy Left    TMJ ARTHROPLASTY Right    TUBAL LIGATION      Family History  Problem Relation Age of Onset   Diabetes Mother    Cancer Mother    CAD Father    Diabetes Sister    CAD Sister    Cancer Sister    Diabetes Maternal Grandmother    CAD Maternal Grandfather    Cancer Maternal Aunt     Social History:  reports that she has never smoked. She has never used smokeless tobacco. She reports that she does not drink alcohol and does not use drugs.The patient is alone today.  Allergies:  Allergies  Allergen Reactions   Codeine Itching, Nausea And Vomiting and Other (See Comments)   Other Nausea And Vomiting and Other (See Comments)    Real strong antibiotics    Current Medications: Current Outpatient Medications  Medication Sig Dispense Refill   colchicine 0.6 MG tablet as directed.     pregabalin (LYRICA) 25 MG capsule Take 25 mg by mouth.  Alpha Lipoic Acid 200 MG CAPS Take 1 tablet by mouth daily.     aspirin  EC (ASPIRIN  LOW DOSE) 81 MG tablet Take 1 tablet (81 mg total) by mouth daily. 90 tablet 1   Cholecalciferol (VITAMIN D-3 PO) Take 2 tablets by mouth daily. Unsure of unit dose     Coenzyme Q10 (CO Q-10) 100 MG CHEW Chew 1 tablet by mouth daily.     Cyanocobalamin (B-12 PO) Take 1 tablet by mouth daily. Unsure of mcg dose     cyanocobalamin (VITAMIN B12) 1000 MCG tablet Take 1,000 mcg by mouth.     ezetimibe (ZETIA) 10 MG tablet Take 10 mg by mouth daily.     magnesium oxide (MAG-OX) 400 (240  Mg) MG tablet Take 400 mg by mouth every other day.     metoprolol  tartrate (LOPRESSOR ) 25 MG tablet TAKE 1 TABLET BY MOUTH TWICE A DAY 180 tablet 3   metroNIDAZOLE (METROGEL) 1 % gel Apply 1 Application topically daily.     milk thistle 175 MG tablet Take 175 mg by mouth daily.     MOUNJARO 5 MG/0.5ML Pen Inject 5 mg into the skin once a week.     OVER THE COUNTER MEDICATION Take 1 Capful by mouth daily. Curamed supplement 1 capsule daily     RESTASIS 0.05 % ophthalmic emulsion Place 1 drop into both eyes 2 (two) times daily.     RHOFADE 1 % CREA Apply 1 Application topically every morning.     No current facility-administered medications for this visit.   Facility-Administered Medications Ordered in Other Visits  Medication Dose Route Frequency Provider Last Rate Last Admin   sodium chloride  flush (NS) 0.9 % injection 10 mL  10 mL Intracatheter PRN Harl Setter A, NP   10 mL at 11/20/21 1052   ASSESSMENT & PLAN:  Assessment: Remote history of granulosa cell carcinoma of the left ovary.  She remains without evidence of recurrence.  Tumor markers are pending from today.    Persistent elevation of the liver enzymes, with findings of fatty liver on CT imaging.   Neuropathy. She has now been switched to Lyrica 25 mg once daily with better improvement.   Probable Sjogren's syndrome with multiple symptoms of arthralgias and myalgias which are suspicious for some form of rheumatologic disorder.   Plan: Patient continues to have diffuse chronic pain, neck pain, worsening motor strength, dry mouth, lower extremity muscle spasm, and sleep disturbance due to pain. Her last x-ray of the lumbar spine was done in January, 2025 and revealed mild multilevel degenerative disc disease and lower lumbar predominant facet arthropathy. She states that she cannot stand or sit for extended periods of time without being in pain and is not a candidate for back surgery due to her heart condition. The patient  informed me that they are suspicious of Sjgren's syndrome and I agree, although it could be systemic Lupus erythematosus. She receives an cortisone injection and pulse of oral Prednisone every 3 months. She also takes Ibuprofen prn to help alleviate her pain but at very low doses. She has an consultation with rheumatologist Nicole Beck coming up and her neurologist has started her on Lyrica 25 mg once daily. She had a positive ANA of 1:160 on 09/15/2023. She reminded me that since receiving a COVID shot in 2020 she began to develop  arthralgias, myalgias, severe dryness of her mouth and eyes, insomnia, and decreased memory. She has since had COVID twice in her life. She has an  elevated WBC of 11.7, hemoglobin of 12.6, and platelet count of 279,000. Her CMP is completely normal and her inhibin B and anti-mullerian hormone levels are pending. I will call her with these results. I will see her back in 6 months with CBC, CMP, AMH, inhibin B, and screening bilateral mammogram. The patient understands the plans discussed today and is in agreement with them.  She knows to contact our office if she develops concerns prior to her next appointment.    I provided 36 minutes of face-to-face time during this encounter and > 50% was spent counseling as documented under my assessment and plan.   Wanda VEAR Cornish, MD  [facility] OF [facility] [address] [place] [address] Dept: [phone] Dept Fax: [phone]   No orders of the defined types were placed in this encounter.    I,Jasmine M Lassiter,acting as a scribe for Wanda VEAR Cornish, MD.,have documented all relevant documentation on the behalf of Wanda VEAR Cornish, MD,as directed by  Wanda VEAR Cornish, MD while in the presence of Wanda VEAR Cornish, MD.

## 2024-07-26 NOTE — Telephone Encounter (Signed)
 Patient has been scheduled for follow-up visit per 07/26/24 LOS.  Pt given an appt calendar with date and time.

## 2024-07-29 LAB — INHIBIN B: Inhibin B: <7 pg/mL

## 2024-08-04 LAB — ANTI MULLERIAN HORMONE: ANTI-MULLERIAN HORMONE (AMH): <0.015 ng/mL

## 2024-08-06 ENCOUNTER — Encounter: Payer: Self-pay | Admitting: Hematology and Oncology

## 2024-09-05 DIAGNOSIS — M069 Rheumatoid arthritis, unspecified: Secondary | ICD-10-CM | POA: Insufficient documentation

## 2024-09-09 ENCOUNTER — Encounter

## 2024-09-27 ENCOUNTER — Ambulatory Visit: Attending: Cardiology | Admitting: Cardiology

## 2024-09-27 ENCOUNTER — Encounter: Payer: Self-pay | Admitting: Cardiology

## 2024-09-27 VITALS — BP 154/74 | HR 86 | Ht 68.0 in | Wt 210.6 lb

## 2024-09-27 DIAGNOSIS — R7689 Other specified abnormal immunological findings in serum: Secondary | ICD-10-CM | POA: Diagnosis not present

## 2024-09-27 DIAGNOSIS — K219 Gastro-esophageal reflux disease without esophagitis: Secondary | ICD-10-CM | POA: Diagnosis not present

## 2024-09-27 DIAGNOSIS — E119 Type 2 diabetes mellitus without complications: Secondary | ICD-10-CM | POA: Diagnosis not present

## 2024-09-27 DIAGNOSIS — I4711 Inappropriate sinus tachycardia, so stated: Secondary | ICD-10-CM | POA: Diagnosis not present

## 2024-09-27 DIAGNOSIS — E785 Hyperlipidemia, unspecified: Secondary | ICD-10-CM | POA: Insufficient documentation

## 2024-09-27 NOTE — Patient Instructions (Signed)
 Medication Instructions:  Your physician recommends that you continue on your current medications as directed. Please refer to the Current Medication list given to you today.  *If you need a refill on your cardiac medications before your next appointment, please call your pharmacy*   Lab Work: None Ordered If you have labs (blood work) drawn today and your tests are completely normal, you will receive your results only by: MyChart Message (if you have MyChart) OR A paper copy in the mail If you have any lab test that is abnormal or we need to change your treatment, we will call you to review the results.   Testing/Procedures: None Ordered   Follow-Up: At [facility], you and your health needs are our priority.  As part of our continuing mission to provide you with exceptional heart care, we have created designated Provider Care Teams.  These Care Teams include your primary Cardiologist (physician) and Advanced Practice Providers (APPs -  Physician Assistants and Nurse Practitioners) who all work together to provide you with the care you need, when you need it.  We recommend signing up for the patient portal called MyChart.  Sign up information is provided on this After Visit Summary.  MyChart is used to connect with patients for Virtual Visits (Telemedicine).  Patients are able to view lab/test results, encounter notes, upcoming appointments, etc.  Non-urgent messages can be sent to your provider as well.   To learn more about what you can do with MyChart, go to [email or web address].    Your next appointment:   5 month(s)  The format for your next appointment:   In Person  Provider:   Lamar Fitch, MD    Other Instructions NA

## 2024-09-27 NOTE — Progress Notes (Unsigned)
 " Cardiology Office Note:    Date:  09/27/2024   ID:  Nicole Beck, DOB 12/30/1965, MRN [id number]  PCP:  Duwaine Donnice PARAS, NP-C  Cardiologist:  Lamar Fitch, MD    Referring MD: Duwaine Donnice PARAS, NP-C   No chief complaint on file. I have a lot of issues  History of Present Illness:    Nicole Beck is a 58 y.o. female past medical history significant for inappropriate sinus tachycardia, essential hypertension, dyslipidemia, diabetes since I seen her last time cardiac wise doing fine, denies have any chest pain tightness squeezing pressure burning chest, last time she was complaining of those symptoms and the plan was to perform coronary CT angio, however, insurance company refused to place for it.  It was not done.  However since that time she did see a rheumatologist there is some concern about rheumatoid arthritis also she does have some neurological deficit there is concern about multiple sclerosis, she is being seen by both rheumatologist as well as neurologist, awaiting MRI.  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   Palpitations 07/02/2015   Plantar fasciitis of right foot 11/27/2023   Positive ANA (antinuclear antibody) 10/26/2023   Postmenopausal bleeding 09/15/2016   Rheumatoid arthritis (HCC) 09/05/2024   Shortness of breath 07/02/2015   Tachycardia    Tubular adenoma of colon 01/10/2021    Past Surgical History:  Procedure Laterality Date    CHOLECYSTECTOMY     KNEE SURGERY Left    left salpingoophorectomy Left    TMJ ARTHROPLASTY Right    TUBAL LIGATION      Current Medications: Active Medications[1]   Allergies:   Codeine and Other   Social History   Socioeconomic History   Marital status: Married    Spouse name: Not on file   Number of children: Not on file   Years of education: Not on file   Highest education level: Not on file  Occupational History   Not on file  Tobacco Use   Smoking status: Never   Smokeless tobacco: Never  Substance and Sexual Activity   Alcohol use: Never   Drug use: Never   Sexual activity: Yes  Other Topics Concern   Not on file  Social History Narrative   ** Merged History Encounter **       Social Drivers of Health   Tobacco Use: Low Risk (09/27/2024)   Patient History    Smoking Tobacco Use: Never    Smokeless Tobacco Use: Never    Passive Exposure: Not on file  Financial Resource Strain: High Risk (08/23/2024)   Received from [facility]   Overall Financial Resource Strain (CARDIA)    How hard is it for you to pay for the very basics like food, housing, medical care, and heating?: Very hard  Food Insecurity: No Food Insecurity (08/23/2024)   Received from [facility]   Epic    Within the past 12 months, you worried that your food would run out before you got the money to buy  more.: Never true    Within the past 12 months, the food you bought just didn't last and you didn't have money to get more.: Never true  Transportation Needs: No Transportation Needs (08/23/2024)   Received from [facility]    In the past 12 months, has lack of transportation kept you from medical appointments or from getting medications?: No    In the past 12 months, has lack of transportation kept you from meetings, work, or from getting things needed for daily living?: No  Physical Activity: Not on file  Stress: Not on file  Social Connections: Unknown (12/01/2022)   Received  from [facility]   Social Network    Social Network: Not on file  Depression (PHQ2-9): Low Risk (07/26/2024)   Depression (PHQ2-9)    PHQ-2 Score: 0  Alcohol Screen: Not on file  Housing: High Risk (08/23/2024)   Received from [facility]    In the last 12 months, was there a time when you were not able to pay the mortgage or rent on time?: Yes    In the past 12 months, how many times have you moved where you were living?: 0    At any time in the past 12 months, were you homeless or living in a shelter (including now)?: No  Utilities: Not At Risk (08/23/2024)   Received from [facility]    In the past 12 months has the electric, gas, oil, or water company threatened to shut off services in your home?: No  Health Literacy: Not on file     Family History: The patient's family history includes CAD in her father, maternal grandfather, and sister; Cancer in her maternal aunt, mother, and sister; Diabetes in her maternal grandmother, mother, and sister. ROS:   Please see the history of present illness.    All 14 point review of systems negative except as described per history of present illness  EKGs/Labs/Other Studies Reviewed:         Recent Labs: 07/26/2024: ALT 25; BUN 13; Creatinine 0.91; Hemoglobin 12.6; Platelet Count 279; Potassium 3.6; Sodium 142  Recent Lipid Panel    Component Value Date/Time   CHOL 166 07/18/2021 1023   TRIG 179 (H) 07/18/2021 1023   HDL 51 07/18/2021 1023   CHOLHDL 3.3 07/18/2021 1023   CHOLHDL 5.0 05/20/2021 1050   VLDL 40 05/20/2021 1050   LDLCALC 85 07/18/2021 1023    Physical Exam:    VS:  BP (!) 154/74   Pulse 86   Ht 5' 8 (1.727 m)   Wt 210 lb 9.6 oz (95.5 kg)   SpO2 97%   BMI 32.02 kg/m     Wt Readings from Last 3 Encounters:  09/27/24 210 lb 9.6 oz (95.5 kg)  07/26/24 205 lb (93 kg)  01/21/24 202 lb 6.4 oz (91.8 kg)     GEN:  Well nourished, well developed in no acute distress HEENT: Normal NECK: No  JVD; No carotid bruits LYMPHATICS: No lymphadenopathy CARDIAC: RRR, no murmurs, no rubs, no gallops RESPIRATORY:  Clear to auscultation without rales, wheezing or rhonchi  ABDOMEN: Soft, non-tender, non-distended MUSCULOSKELETAL:  No edema; No deformity  SKIN: Warm and dry LOWER EXTREMITIES: no swelling NEUROLOGIC:  Alert and oriented x 3 PSYCHIATRIC:  Normal affect   ASSESSMENT:    1. Inappropriate sinus tachycardia   2. Gastroesophageal reflux disease without esophagitis   3. Diabetes mellitus without complication (HCC)   4.  Dyslipidemia   5. Positive ANA (antinuclear antibody)    PLAN:    In order of problems listed above:  Constellation of a lot of symptoms aggressive workup by neurology as well as rheumatology.  Will await results of this workup to see if we need to do any cardiac evaluation.  Likely she denies have any typical symptoms at this time on physical exam but she also said that since that time of recent symptomatology remaining balance issue her ability to exercise is limited and she simply does not walk as much.  That may be explaining why she had does have symptomatology.  Will wait for results of neurology and rheumatology, workup before proceeding with cardiac evaluation. Dyslipidemia she stopped statin because of all symptomatology she has. Diabetes mellitus that being follow-up by primary care physician   Medication Adjustments/Labs and Tests Ordered: Current medicines are reviewed at length with the patient today.  Concerns regarding medicines are outlined above.  No orders of the defined types were placed in this encounter.  Medication changes: No orders of the defined types were placed in this encounter.   Signed, Lamar DOROTHA Fitch, MD, [facility] 09/27/2024 4:53 PM    [facility]    [1]  Current Meds  Medication Sig   Alpha Lipoic Acid 200 MG CAPS Take 1 tablet by mouth daily.   aspirin  EC (ASPIRIN  LOW DOSE) 81 MG tablet Take  1 tablet (81 mg total) by mouth daily.   Cholecalciferol (VITAMIN D-3 PO) Take 2 tablets by mouth daily. Unsure of unit dose   Coenzyme Q10 (CO Q-10) 100 MG CHEW Chew 1 tablet by mouth daily.   colchicine 0.6 MG tablet as directed.   cyanocobalamin (VITAMIN B12) 1000 MCG tablet Take 1,000 mcg by mouth.   ENBREL SURECLICK 50 MG/ML injection Inject 50 mg into the skin once a week.   ezetimibe (ZETIA) 10 MG tablet Take 10 mg by mouth daily.   metoprolol  tartrate (LOPRESSOR ) 25 MG tablet TAKE 1 TABLET BY MOUTH TWICE A DAY   metroNIDAZOLE (METROGEL) 1 % gel Apply 1 Application topically daily.   milk thistle 175 MG tablet Take 175 mg by mouth daily.   MOUNJARO 5 MG/0.5ML Pen Inject 5 mg into the skin once a week.   OVER THE COUNTER MEDICATION Take 1 Capful by mouth daily. Curamed supplement 1 capsule daily   pregabalin (LYRICA) 25 MG capsule Take 25 mg by mouth.   RHOFADE 1 % CREA Apply 1 Application topically every morning.   "

## 2024-10-05 ENCOUNTER — Other Ambulatory Visit: Payer: Self-pay

## 2024-10-05 DIAGNOSIS — M5030 Other cervical disc degeneration, unspecified cervical region: Secondary | ICD-10-CM

## 2024-10-11 NOTE — Progress Notes (Unsigned)
 "  Office Visit Note  Patient: Nicole Beck             Date of Birth: [birth year]           MRN: [id number]             PCP: Duwaine Donnice PARAS, NP-C Referring: Cornelius Wanda DEL, MD Visit Date: 10/18/2024 Occupation: Data Unavailable  Subjective:  No chief complaint on file.   History of Present Illness: Nicole Beck is a 59 y.o. female ***     Activities of Daily Living:  Patient reports morning stiffness for *** {minute/hour:19697}.   Patient {ACTIONS;DENIES/REPORTS:21021675::Denies} nocturnal pain.  Difficulty dressing/grooming: {ACTIONS;DENIES/REPORTS:21021675::Denies} Difficulty climbing stairs: {ACTIONS;DENIES/REPORTS:21021675::Denies} Difficulty getting out of chair: {ACTIONS;DENIES/REPORTS:21021675::Denies} Difficulty using hands for taps, buttons, cutlery, and/or writing: {ACTIONS;DENIES/REPORTS:21021675::Denies}  No Rheumatology ROS completed.   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    Class: Chronic   Plantar fasciitis of right foot 11/27/2023   Lumbar spondylosis 10/30/2023   Positive ANA (antinuclear antibody) 10/26/2023   Hepatic steatosis 03/24/2023   Gastroesophageal reflux disease without esophagitis 02/11/2023   Dyslipidemia 06/24/2022   Diabetes mellitus without complication (HCC) 08/31/2021   Tubular adenoma of colon 01/10/2021    Class: History of   Tachycardia    Atrial tachycardia 10/13/2016   Inappropriate sinus tachycardia 10/13/2016   Postmenopausal bleeding 09/15/2016   Shortness of breath 07/02/2015   Palpitations 07/02/2015   Atypical chest pain 07/02/2015   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   Malignant neoplasm of left ovary (HCC) 12/31/2012    Past Medical History:  Diagnosis Date   Atrial tachycardia 10/13/2016   Atypical chest pain 07/02/2015   Diabetes mellitus  without complication (HCC)    diet controlled   Dyslipidemia 06/24/2022   Gastroesophageal reflux disease without esophagitis 02/11/2023   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   Hepatic steatosis 03/24/2023   Inappropriate sinus tachycardia 10/13/2016   Lumbar spondylosis 10/30/2023   Malignant neoplasm of left ovary (HCC) 12/31/2012   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   Palpitations 07/02/2015   Plantar fasciitis of right foot 11/27/2023   Positive ANA (antinuclear antibody) 10/26/2023   Postmenopausal bleeding 09/15/2016   Rheumatoid arthritis (HCC) 09/05/2024   Shortness of breath 07/02/2015   Tachycardia    Tubular adenoma of colon 01/10/2021    Family History  Problem Relation Age of Onset   Diabetes Mother    Cancer Mother    CAD Father    Diabetes Sister    CAD Sister    Cancer Sister    Diabetes Maternal Grandmother    CAD Maternal Grandfather    Cancer Maternal Aunt    Past Surgical History:  Procedure Laterality Date   CHOLECYSTECTOMY     KNEE SURGERY Left    left salpingoophorectomy Left    TMJ ARTHROPLASTY Right    TUBAL LIGATION     Social History[1] Social History   Social History Narrative   ** Merged History Encounter **         Immunization History  Administered Date(s) Administered   PFIZER(Purple Top)SARS-COV-2 Vaccination 06/25/2020, 07/18/2020     Objective: Vital Signs: There were no vitals taken for this visit.   Physical Exam   Musculoskeletal Exam: ***  CDAI Exam: CDAI Score: -- Patient Global: --; Provider Global: -- Swollen: --; Tender: -- Joint Exam 10/18/2024  No joint exam has been documented for this visit   There is currently no information documented on the homunculus. Go to the Rheumatology activity and complete the homunculus joint exam.  Investigation: No additional findings.  Imaging: No results found.  Recent Labs: Lab Results  Component Value Date   WBC 11.7 (H)  07/26/2024   HGB 12.6 07/26/2024   PLT 279 07/26/2024   NA 142 07/26/2024   K 3.6 07/26/2024   CL 103 07/26/2024   CO2 26 07/26/2024   GLUCOSE 101 (H) 07/26/2024   BUN 13 07/26/2024   CREATININE 0.91 07/26/2024   BILITOT 0.4 07/26/2024   ALKPHOS 116 07/26/2024   AST 21 07/26/2024   ALT 25 07/26/2024   PROT 7.3 07/26/2024   ALBUMIN 4.3 07/26/2024   CALCIUM  9.7 07/26/2024   GFRAA 80 05/03/2018    Speciality Comments: No specialty comments available.  Procedures:  No procedures performed Allergies: Codeine and Other   Assessment / Plan:     Visit Diagnoses: No diagnosis found.  Orders: No orders of the defined types were placed in this encounter.  No orders of the defined types were placed in this encounter.   Face-to-face time spent with patient was *** minutes. Greater than 50% of time was spent in counseling and coordination of care.  Follow-Up Instructions: No follow-ups on file.   Alfonso Patterson, LPN  Note - This record has been created using [employer].  Chart creation errors have been sought, but may not always  have been located. Such creation errors do not reflect on  the standard of medical care.    [1]  Social History Tobacco Use   Smoking status: Never   Smokeless tobacco: Never  Substance Use Topics   Alcohol use: Never   Drug use: Never   "

## 2024-10-18 ENCOUNTER — Ambulatory Visit

## 2024-11-06 ENCOUNTER — Other Ambulatory Visit

## 2024-11-22 ENCOUNTER — Other Ambulatory Visit

## 2025-01-25 ENCOUNTER — Inpatient Hospital Stay: Admitting: Oncology

## 2025-01-25 ENCOUNTER — Ambulatory Visit (HOSPITAL_BASED_OUTPATIENT_CLINIC_OR_DEPARTMENT_OTHER): Admitting: Radiology

## 2025-01-25 ENCOUNTER — Inpatient Hospital Stay
